# Patient Record
Sex: Female | Born: 1955 | Race: White | Hispanic: No | Marital: Married | Smoking: Never smoker
Health system: Southern US, Community
[De-identification: ages and names within clinical notes are randomized; demographics above are authoritative.]

## PROBLEM LIST (undated history)

## (undated) DIAGNOSIS — G20A1 Parkinson's disease without dyskinesia, without mention of fluctuations: Secondary | ICD-10-CM

## (undated) DIAGNOSIS — Z9221 Personal history of antineoplastic chemotherapy: Secondary | ICD-10-CM

## (undated) DIAGNOSIS — G8190 Hemiplegia, unspecified affecting unspecified side: Secondary | ICD-10-CM

## (undated) DIAGNOSIS — I10 Essential (primary) hypertension: Secondary | ICD-10-CM

## (undated) DIAGNOSIS — C50919 Malignant neoplasm of unspecified site of unspecified female breast: Secondary | ICD-10-CM

## (undated) DIAGNOSIS — Z780 Asymptomatic menopausal state: Secondary | ICD-10-CM

## (undated) DIAGNOSIS — L309 Dermatitis, unspecified: Secondary | ICD-10-CM

## (undated) DIAGNOSIS — Z923 Personal history of irradiation: Secondary | ICD-10-CM

## (undated) DIAGNOSIS — M25569 Pain in unspecified knee: Secondary | ICD-10-CM

## (undated) DIAGNOSIS — D219 Benign neoplasm of connective and other soft tissue, unspecified: Secondary | ICD-10-CM

## (undated) HISTORY — PX: COLONOSCOPY: SHX174

## (undated) HISTORY — PX: BREAST LUMPECTOMY: SHX2

## (undated) HISTORY — DX: Dermatitis, unspecified: L30.9

## (undated) HISTORY — PX: WISDOM TOOTH EXTRACTION: SHX21

## (undated) HISTORY — DX: Pain in unspecified knee: M25.569

## (undated) HISTORY — PX: TOTAL ABDOMINAL HYSTERECTOMY: SHX209

## (undated) HISTORY — DX: Parkinson's disease without dyskinesia, without mention of fluctuations: G20.A1

## (undated) HISTORY — DX: Hemiplegia, unspecified affecting unspecified side: G81.90

## (undated) HISTORY — DX: Benign neoplasm of connective and other soft tissue, unspecified: D21.9

## (undated) HISTORY — PX: OTHER SURGICAL HISTORY: SHX169

## (undated) HISTORY — PX: TRIGGER FINGER RELEASE: SHX641

## (undated) HISTORY — PX: SHOULDER ARTHROSCOPY: SHX128

## (undated) HISTORY — DX: Essential (primary) hypertension: I10

## (undated) HISTORY — DX: Asymptomatic menopausal state: Z78.0

## (undated) HISTORY — PX: CYSTOSCOPY: SUR368

---

## 1998-07-22 ENCOUNTER — Ambulatory Visit (HOSPITAL_COMMUNITY): Admission: RE | Admit: 1998-07-22 | Discharge: 1998-07-22 | Payer: Self-pay | Admitting: Unknown Physician Specialty

## 1999-07-22 ENCOUNTER — Encounter: Payer: Self-pay | Admitting: Obstetrics and Gynecology

## 1999-07-22 ENCOUNTER — Ambulatory Visit (HOSPITAL_COMMUNITY): Admission: RE | Admit: 1999-07-22 | Discharge: 1999-07-22 | Payer: Self-pay | Admitting: Obstetrics and Gynecology

## 2000-09-06 ENCOUNTER — Encounter: Payer: Self-pay | Admitting: Obstetrics and Gynecology

## 2000-09-06 ENCOUNTER — Ambulatory Visit (HOSPITAL_COMMUNITY): Admission: RE | Admit: 2000-09-06 | Discharge: 2000-09-06 | Payer: Self-pay | Admitting: Obstetrics and Gynecology

## 2001-09-10 ENCOUNTER — Ambulatory Visit (HOSPITAL_COMMUNITY): Admission: RE | Admit: 2001-09-10 | Discharge: 2001-09-10 | Payer: Self-pay | Admitting: Internal Medicine

## 2001-09-10 ENCOUNTER — Encounter: Payer: Self-pay | Admitting: Internal Medicine

## 2002-09-11 ENCOUNTER — Encounter: Payer: Self-pay | Admitting: Obstetrics and Gynecology

## 2002-09-11 ENCOUNTER — Ambulatory Visit (HOSPITAL_COMMUNITY): Admission: RE | Admit: 2002-09-11 | Discharge: 2002-09-11 | Payer: Self-pay | Admitting: Obstetrics and Gynecology

## 2003-12-17 ENCOUNTER — Ambulatory Visit (HOSPITAL_COMMUNITY): Admission: RE | Admit: 2003-12-17 | Discharge: 2003-12-17 | Payer: Self-pay | Admitting: Obstetrics and Gynecology

## 2004-12-20 ENCOUNTER — Ambulatory Visit (HOSPITAL_COMMUNITY): Admission: RE | Admit: 2004-12-20 | Discharge: 2004-12-20 | Payer: Self-pay | Admitting: Obstetrics and Gynecology

## 2005-12-21 ENCOUNTER — Ambulatory Visit (HOSPITAL_COMMUNITY): Admission: RE | Admit: 2005-12-21 | Discharge: 2005-12-21 | Payer: Self-pay | Admitting: Obstetrics and Gynecology

## 2007-01-01 ENCOUNTER — Ambulatory Visit (HOSPITAL_COMMUNITY): Admission: RE | Admit: 2007-01-01 | Discharge: 2007-01-01 | Payer: Self-pay | Admitting: Obstetrics and Gynecology

## 2008-03-12 ENCOUNTER — Ambulatory Visit (HOSPITAL_COMMUNITY): Admission: RE | Admit: 2008-03-12 | Discharge: 2008-03-12 | Payer: Self-pay | Admitting: Obstetrics and Gynecology

## 2008-09-16 ENCOUNTER — Ambulatory Visit: Payer: Self-pay | Admitting: Sports Medicine

## 2008-09-16 DIAGNOSIS — M2170 Unequal limb length (acquired), unspecified site: Secondary | ICD-10-CM

## 2008-09-16 DIAGNOSIS — M629 Disorder of muscle, unspecified: Secondary | ICD-10-CM

## 2008-09-16 DIAGNOSIS — M25569 Pain in unspecified knee: Secondary | ICD-10-CM | POA: Insufficient documentation

## 2008-10-14 ENCOUNTER — Ambulatory Visit: Payer: Self-pay | Admitting: Sports Medicine

## 2009-03-13 ENCOUNTER — Ambulatory Visit (HOSPITAL_COMMUNITY): Admission: RE | Admit: 2009-03-13 | Discharge: 2009-03-13 | Payer: Self-pay | Admitting: Obstetrics and Gynecology

## 2016-06-30 ENCOUNTER — Ambulatory Visit (INDEPENDENT_AMBULATORY_CARE_PROVIDER_SITE_OTHER): Payer: PRIVATE HEALTH INSURANCE | Admitting: Neurology

## 2016-06-30 ENCOUNTER — Encounter: Payer: Self-pay | Admitting: Neurology

## 2016-06-30 VITALS — BP 127/81 | HR 65 | Resp 14 | Ht 63.0 in | Wt 133.0 lb

## 2016-06-30 DIAGNOSIS — G1221 Amyotrophic lateral sclerosis: Secondary | ICD-10-CM | POA: Diagnosis not present

## 2016-06-30 DIAGNOSIS — R252 Cramp and spasm: Secondary | ICD-10-CM

## 2016-06-30 DIAGNOSIS — G20 Parkinson's disease: Secondary | ICD-10-CM

## 2016-06-30 DIAGNOSIS — G8190 Hemiplegia, unspecified affecting unspecified side: Secondary | ICD-10-CM

## 2016-06-30 DIAGNOSIS — G35 Multiple sclerosis: Secondary | ICD-10-CM | POA: Diagnosis not present

## 2016-06-30 DIAGNOSIS — R200 Anesthesia of skin: Secondary | ICD-10-CM | POA: Diagnosis not present

## 2016-06-30 DIAGNOSIS — R258 Other abnormal involuntary movements: Secondary | ICD-10-CM | POA: Diagnosis not present

## 2016-06-30 DIAGNOSIS — G1229 Other motor neuron disease: Secondary | ICD-10-CM

## 2016-06-30 DIAGNOSIS — R202 Paresthesia of skin: Secondary | ICD-10-CM

## 2016-06-30 DIAGNOSIS — W19XXXA Unspecified fall, initial encounter: Secondary | ICD-10-CM

## 2016-06-30 NOTE — Progress Notes (Signed)
[facility]    Provider:  Dr Lucia GaskinsAhern Referring Provider: Jarrett SohoWharton, Courtney, PA-C Primary Care Physician:  Jarrett SohoWharton, Courtney, PA-C  CC:  Stiff right arm, right hand weakness, right leg stiff and some tremor  HPI:  Breanna Powers is a 60 y.o. female here as a referral from Dr. Delena ServeWharton for Tremor. Past medical history of one alcoholic beverage a day, 4 cups daily of caffeine, otherwise no significant past medical history. She has a family history of Parkinson's disease in her father. About a year ago she noticed that her right arm was staying in a flexed position at the elbow. She has noticed weakness in her right arm and right leg. Symptoms are slowly progressive. Her family started noticing as well. No pain in the limbs just mostly weakness and tremor and decreased movement. She also started noticing tremor mostly with action and also at rest. Lately she has broken 2 of her husbands statues due to tremor and right arm weakness, she is getting tremor worse with action and with fine motor tasks, dropping objects, she has a tremor endorses it at rest as well. Her right leg drags a little when she walks. But she just got done a triathlon. She runs. She has numbness and tingling in the right arm more in the forearm and in the hand the whole hand. Her hand will clench and fist. It is slowly progressing to the point where other people are noticing. No neck pain, no radicular symptoms, no known triggering events, worse with fatigue. No trauma or inciting events. She has cramps in the right leg in the calf and in the foot. No vision changes, no slurred or changes in speech, no memory changes, no headaches, no neck pain or radicular symptoms. She is tripping over the right foot and has fallen. Symptoms are slowly progressive. She even has to brush with the left hand now because of the symptoms. No other focal neurologic deficits or complaints. No other family history of tremors or neuromuscular  disorders. Her father was diagnosed in his early 7460s with Parkinson's disease.  Reviewed notes, labs and imaging from outside physicians, which showed:  Review primary care notes. She is noted intermittent weakness of right upper extremity, associated tremor at rest which is trying to do things such as work on computer or lifting objects, does not seem to be worse over the last 6 months. She has been more aware of it the last 2 months when she would go running. Feels that she is dragging her right leg when she runs like it is more benefit to move. No pain associated. No numbness or tingling. Reviewed exam which showed normal general appearance, speech, headache, mouth, nose, pharynx, neck, chest, heart, breasts, abdomen, extremities. Neurologically she had gross motor and sensory normal strength, sensation and gait, heel-to-toe normal, finger to nose with very slight tremor noted of right arm with task, strength of right upper extremity very slightly decreased compared to left with flexion at elbow and shoulder specifically, grip strength normal bilaterally, normal range of motion. No decreased ROM. No other tremors or neuromuscular or degenerative disease in the family. Father diagnosed in his early 260s.   Labs ordered included vitamin D, HCV, B12, TSH, CMP and lipid panel(results not included in paperwork will request records)  Creatinine 0.96, TSH 1.290 labs drawn on 06/24/2016.  Review of Systems: Patient complains of symptoms per HPI as well as the following symptoms: Numbness, weakness, tremor. Pertinent negatives per HPI. All others negative.  Social History   Social History  . Marital status: Married    Spouse name: N/A  . Number of children: 0  . Years of education: Assoc   Occupational History  . [employer]    Social History Main Topics  . Smoking status: Never Smoker  . Smokeless tobacco: Never Used  . Alcohol use Yes  . Drug use: No  . Sexual activity: Not on file    Other Topics Concern  . Not on file   Social History Narrative   Patient drinks 3-4 cups of coffee a day     Family History  Problem Relation Age of Onset  . Parkinson's disease Father   . Heart disease Father   . Breast cancer Sister   . Breast cancer Paternal Aunt   . Breast cancer Paternal Grandmother   . Heart disease Paternal Grandfather     Past Medical History:  Diagnosis Date  . Knee pain     Past Surgical History:  Procedure Laterality Date  . WISDOM TOOTH EXTRACTION      Current Outpatient Prescriptions  Medication Sig Dispense Refill  . Cholecalciferol (VITAMIN D3) 1000 units CAPS Take by mouth.    . Multiple Vitamin (MULTIVITAMIN) tablet Take by mouth.    . Omega-3 Fatty Acids (FISH OIL) 1200 MG CAPS Take by mouth.    . Vitamins-Lipotropics (B-STRESS PO) Take by mouth.     No current facility-administered medications for this visit.     Allergies as of 06/30/2016  . (No Known Allergies)    Vitals: BP 127/81   Pulse 65   Resp 14   Ht 5\' 3"  (1.6 m)   Wt 133 lb (60.3 kg)   BMI 23.56 kg/m  Last Weight:  Wt Readings from Last 1 Encounters:  06/30/16 133 lb (60.3 kg)   Last Height:   Ht Readings from Last 1 Encounters:  06/30/16 5\' 3"  (1.6 m)    Physical exam: Exam: Gen: NAD, conversant, well nourised, well groomed                     CV: RRR, no MRG. No Carotid Bruits. No peripheral edema, warm, nontender Eyes: Conjunctivae clear without exudates or hemorrhage  Neuro: Detailed Neurologic Exam  Speech:    Speech is normal; fluent and spontaneous with normal comprehension.  Cognition:    The patient is oriented to person, place, and time;     recent and remote memory intact;     language fluent;     normal attention, concentration,     fund of knowledge Cranial Nerves:    The pupils are equal, round, and reactive to light. The fundi are normal and spontaneous venous pulsations are present. Visual fields are full to finger  confrontation. Extraocular movements are intact. Trigeminal sensation is intact and the muscles of mastication are normal. The face is symmetric. The palate elevates in the midline. Hearing intact. Voice is normal. Shoulder shrug is normal. The tongue has normal motion without fasciculations.   Coordination:    No dysmetria on finger-to-nose or heel-to-shin  Gait: decreased arm swing on the right. Heel-toe and tandem gait are normal.   Motor Observation:     No asymmetry, no atrophy, Mild postural tremor right hand. Right arm is held in flexion at the elbow. Tone:    Increased in the right arm with cogwheeling on facilitation  Posture:    Posture is normal. normal erect    Strength: Right pronator drift. Right  deltoid 4/5, right biceps 5-, right triceps 3+/5, right UE extensors 4/5. Dec grip on the right.  5-/5 right hip flexion  and leg flexion otherwise normal.         Sensation: Decreased to pinprick in the right arm intact in the right     Reflex Exam:  DTR's:    Deep tendon reflexes in the upper and lower extremities are brisk bilaterally with relative increase on the right Toes:    The toes are downgoing bilaterally.   Clonus:    Clonus is absent.      Assessment/Plan:  This is a 60 year old female with no significant past medical history with right-sided arm and leg weakness, increased tone in the right arm with cogwheeling on facilitation, increased reflexes on the right, decreased arm swing on the right, mild postural tremor, no resting tremor noted, decreased sensation in the right arm, holds right arm flexion at the elbow. Unclear etiology, has some signs of parkinsonism but the weakness and sensory loss is unusual for this condition. We'll check MRI of the brain and cervical spine for lesions or masses. We'll perform a 4 limb EMG as well. Pending results of the studies may need further testing.  Cc: Irene Shipper, MD  [facility] [address] [place], [place] [address]  Phone [phone] Fax [phone]

## 2016-06-30 NOTE — Patient Instructions (Signed)
Remember to drink plenty of fluid, eat healthy meals and do not skip any meals. Try to eat protein with a every meal and eat a healthy snack such as fruit or nuts in between meals. Try to keep a regular sleep-wake schedule and try to exercise daily, particularly in the form of walking, 20-30 minutes a day, if you can.   As far as diagnostic testing: MRI of the brain and cervical cord, emg/ncs  I would like to see you back for emg/ncs, sooner if we need to. Please call us with any interim questions, concerns, problems, updates or refill requests.   Our phone number is [phone]. We also have an after hours call service for urgent matters and there is a physician on-call for urgent questions. For any emergencies you know to call 911 or go to the nearest emergency room

## 2016-07-07 ENCOUNTER — Telehealth: Payer: Self-pay | Admitting: Neurology

## 2016-07-07 NOTE — Telephone Encounter (Signed)
I highly encourage patient to have the tests done, she has progressive right-sided weakness and this could be the sign of something serious like a tumor or mass in the brain or cervical spine. I highly recommend the MRIs, we can hold off on the emg. Please let her know. If she absolutely cannot have the MRIs We could at least order a CT scan of the brain which isn't as good as MRi and may miss small lesions however it is some imaging of the brain if she cannot afford the MRIs. thanks

## 2016-07-07 NOTE — Telephone Encounter (Signed)
Dr Lucia Gaskins- please advise  Tried calling patient back on work number. Ann picked up and said it was wrong number.   Called home number listed and spoke to patient. She stated something came up. Cannot afford at this time to have the MRI/EMG studies. She states she wants to wait until the first of the year and she is going to call back about testing. She has no f/u scheduled at this time which she is aware of. She wanted to make sure Dr Lucia Gaskins was okay with her holding off with tests and that she wasn't doing anything "stupid" by not having them done. Advised I will give her the message and call her back if she feels she needs the testing before then. She verbalized understanding.

## 2016-07-07 NOTE — Telephone Encounter (Signed)
Pt called in wanting to discuss MRI and EMG orders. She can not afford them at this time . Please call and advise

## 2016-07-08 NOTE — Telephone Encounter (Signed)
Dr Lucia Gaskins- FYI  Called patient back. Relayed Dr Trevor Mace message below. She verbalized understanding. She stated she was talking with her husband and a friend who recommended massage therapy. She is going to look into a company called [facility] as an alternative.   I did advise she can set up a payment plan for MRI. This is an option. She verbalized understanding and stated intially she "panicked" and did not know what to do. She had called her insurance company and found out her copay was $100 and then they will pay 60%. She has a 3,000 dollar deductible. She requested she think about it and call back later today. I did advise we close at 12pm. She requested to take the weekend to think about it and will call back Monday to let us know what she would like to do. She is going to do her research and find out how much MRI would cost her. She did not find out this information from before. Advised I will let Dr Lucia Gaskins know.

## 2016-07-12 ENCOUNTER — Other Ambulatory Visit: Payer: Self-pay

## 2016-07-14 ENCOUNTER — Encounter: Payer: PRIVATE HEALTH INSURANCE | Admitting: Neurology

## 2016-08-25 ENCOUNTER — Other Ambulatory Visit: Payer: Self-pay | Admitting: Family Medicine

## 2016-08-25 DIAGNOSIS — Z1231 Encounter for screening mammogram for malignant neoplasm of breast: Secondary | ICD-10-CM

## 2016-09-27 ENCOUNTER — Ambulatory Visit
Admission: RE | Admit: 2016-09-27 | Discharge: 2016-09-27 | Disposition: A | Discharge status: Home / Self Care | Payer: PRIVATE HEALTH INSURANCE | Source: Ambulatory Visit | Attending: Family Medicine | Admitting: Family Medicine

## 2016-09-27 DIAGNOSIS — Z1231 Encounter for screening mammogram for malignant neoplasm of breast: Secondary | ICD-10-CM

## 2018-02-12 IMAGING — MG 2D DIGITAL SCREENING BILATERAL MAMMOGRAM WITH CAD AND ADJUNCT TO
9 of 13 series · 9 of 29 positions shown · non-contrast
Comparison: Previous exam(s).

CLINICAL DATA: Screening.

EXAM:
2D DIGITAL SCREENING BILATERAL MAMMOGRAM WITH CAD AND ADJUNCT TOMO

[R CC (1 of 2)]
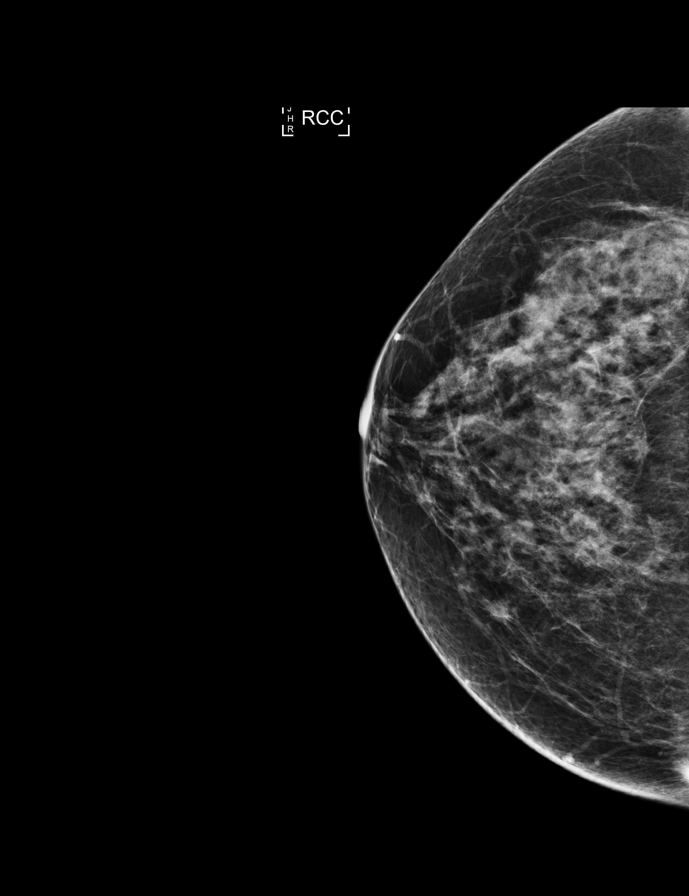

[R CC (2 of 2)]
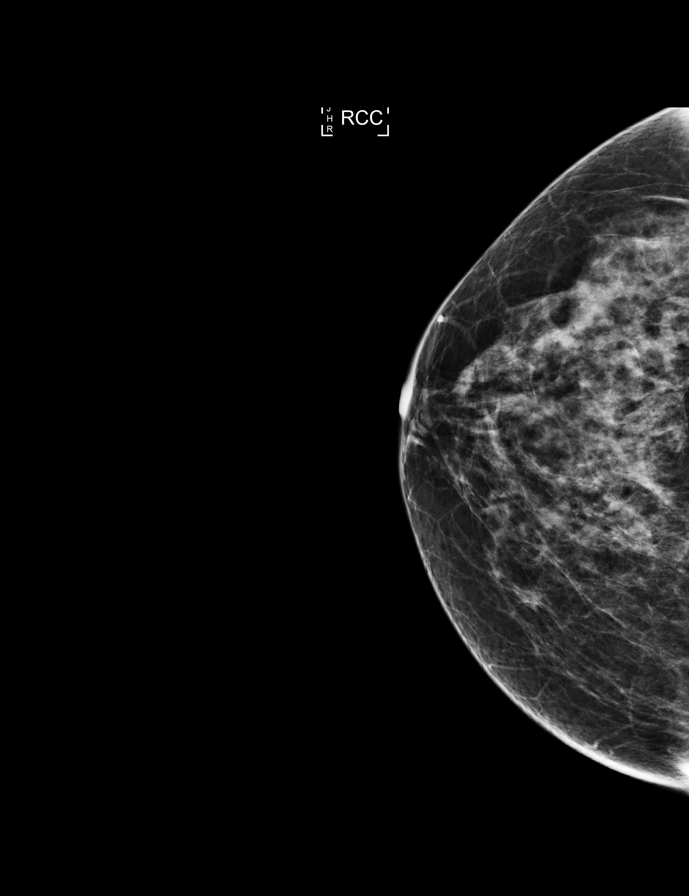

[L CC synth-2D]
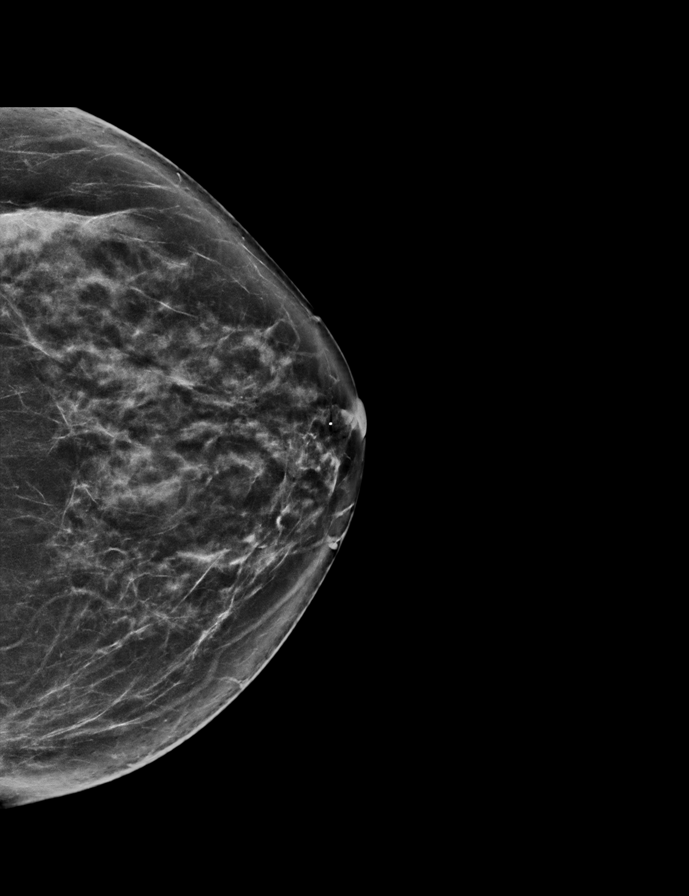

[L MLO synth-2D]
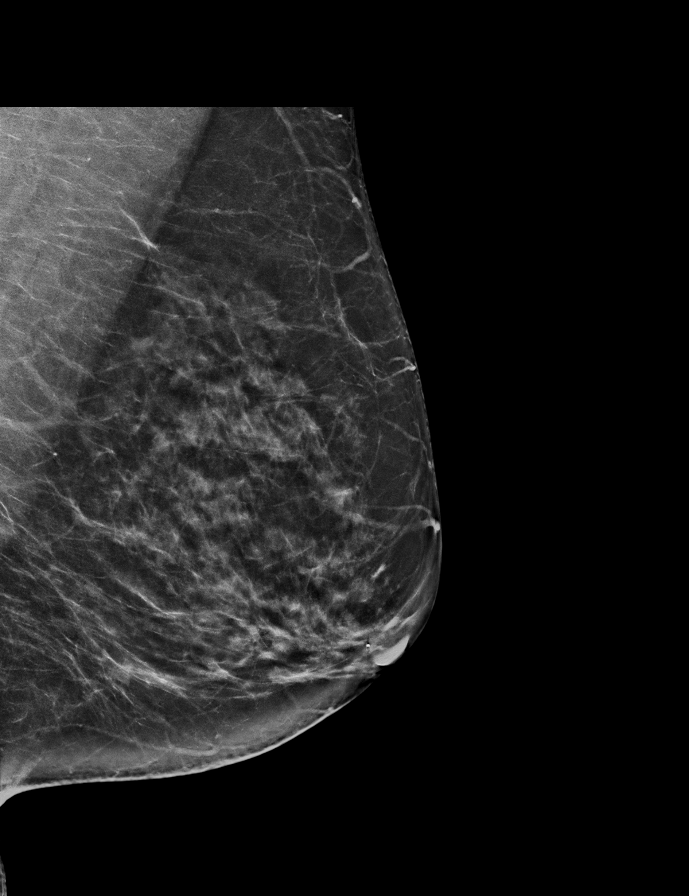

[L MLO]
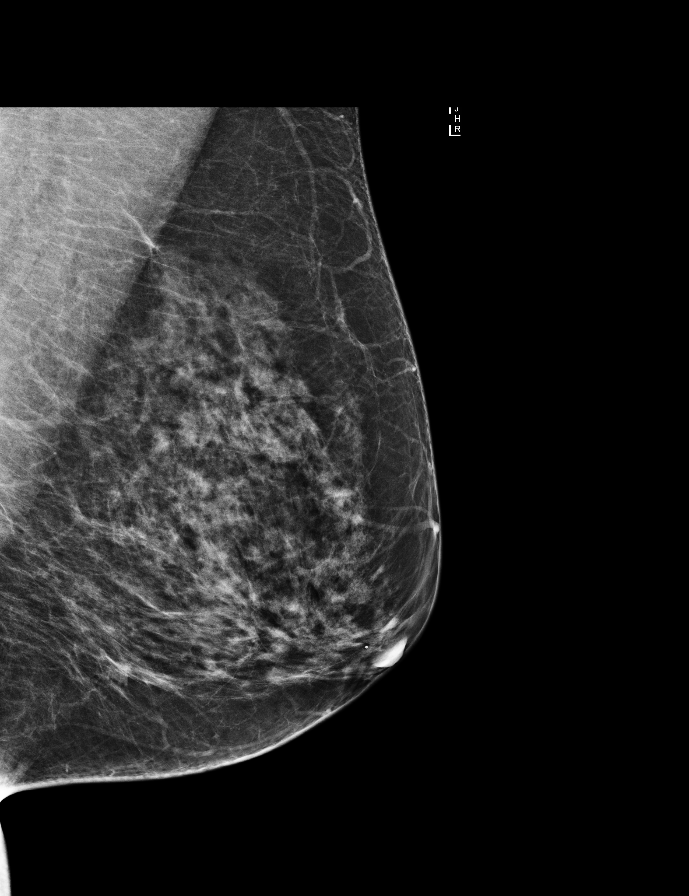

[R MLO synth-2D]
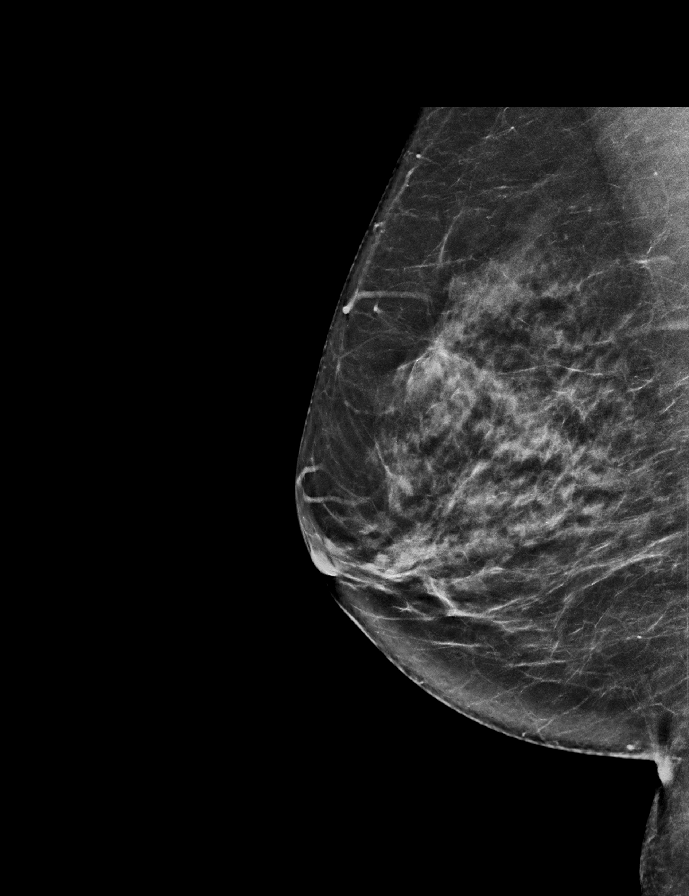

[R MLO]
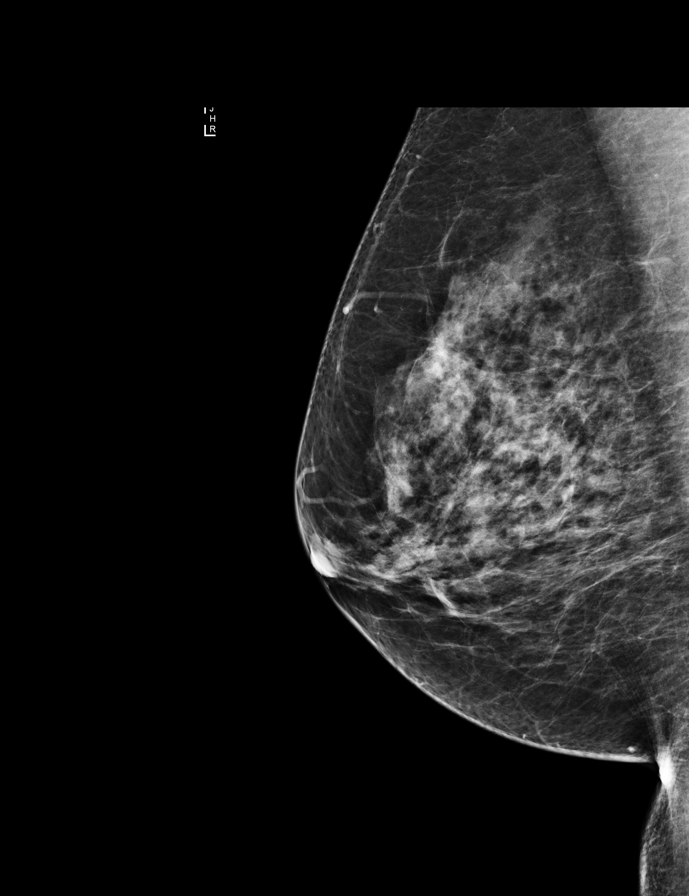

[R CC synth-2D]
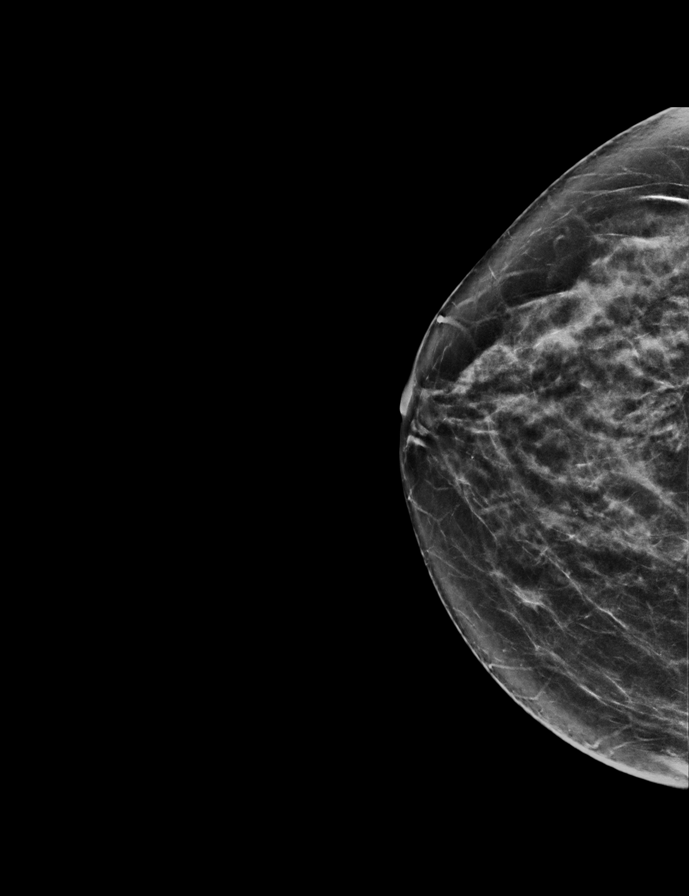

[L CC]
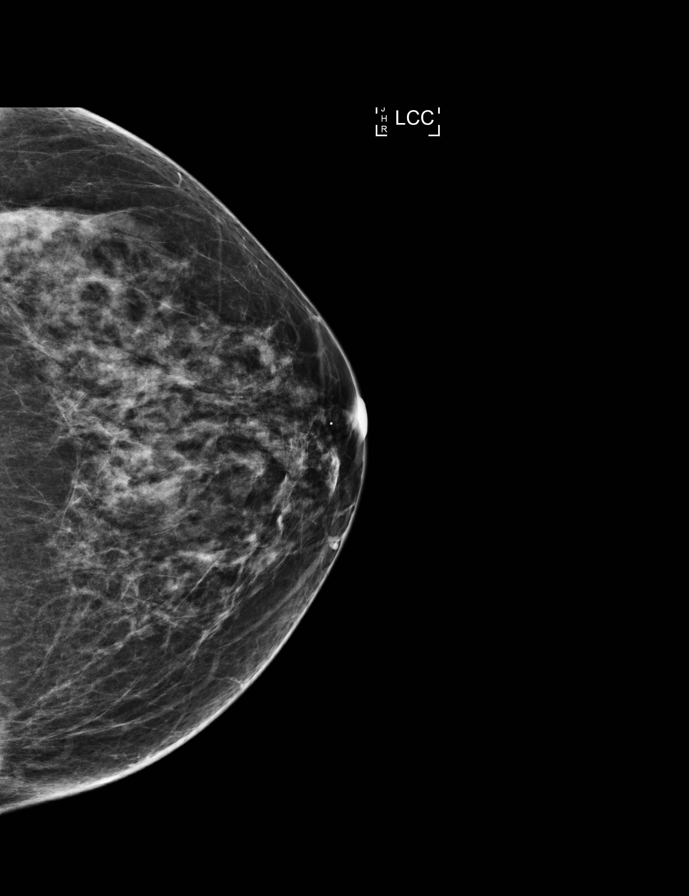

[9 of 29 positions shown; findings below may reference images not displayed]

ACR Breast Density Category c: The breast tissue is heterogeneously
dense, which may obscure small masses.
FINDINGS: There are no findings suspicious for malignancy. Images were
processed with CAD.
IMPRESSION: No mammographic evidence of malignancy. A result letter of this
screening mammogram will be mailed directly to the patient.

RECOMMENDATION:
Screening mammogram in one year. (Code:[id number])

BI-RADS CATEGORY  1: Negative.

## 2022-10-31 DIAGNOSIS — K080 Exfoliation of teeth due to systemic causes: Secondary | ICD-10-CM | POA: Diagnosis not present

## 2022-12-05 ENCOUNTER — Telehealth: Payer: Self-pay

## 2022-12-05 NOTE — Telephone Encounter (Signed)
While I would generally recommend staying w Dr. Linus Mako and his practice, if the patient still wants to switch to [facility], I would be okay with the [facility] and provider switch.

## 2022-12-05 NOTE — Telephone Encounter (Signed)
Completely fine, thanks

## 2022-12-05 NOTE — Telephone Encounter (Signed)
We received a new referral for a patient that was last seen by Dr Jaynee Eagles in 2017 for hemiparesis. She is currently established with Dr. Linus Mako at [facility] for Parkinson's Disease, but is retiring soon and would like to establish closer to home for her care. She is requesting to transfer care to Dr Rexene Alberts.  Please advise if this is acceptable.  Thanks!

## 2023-01-09 DIAGNOSIS — K080 Exfoliation of teeth due to systemic causes: Secondary | ICD-10-CM | POA: Diagnosis not present

## 2023-01-11 DIAGNOSIS — K080 Exfoliation of teeth due to systemic causes: Secondary | ICD-10-CM | POA: Diagnosis not present

## 2023-02-01 DIAGNOSIS — I10 Essential (primary) hypertension: Secondary | ICD-10-CM | POA: Diagnosis not present

## 2023-02-01 DIAGNOSIS — G20C Parkinsonism, unspecified: Secondary | ICD-10-CM | POA: Diagnosis not present

## 2023-02-01 DIAGNOSIS — R159 Full incontinence of feces: Secondary | ICD-10-CM | POA: Diagnosis not present

## 2023-02-01 DIAGNOSIS — R251 Tremor, unspecified: Secondary | ICD-10-CM | POA: Diagnosis not present

## 2023-02-06 DIAGNOSIS — K080 Exfoliation of teeth due to systemic causes: Secondary | ICD-10-CM | POA: Diagnosis not present

## 2023-03-01 ENCOUNTER — Encounter: Payer: Self-pay | Admitting: *Deleted

## 2023-03-02 ENCOUNTER — Ambulatory Visit: Payer: Medicare Other | Admitting: Neurology

## 2023-03-02 ENCOUNTER — Encounter: Payer: Self-pay | Admitting: Neurology

## 2023-03-02 VITALS — BP 114/68 | HR 81 | Ht 62.0 in | Wt 135.0 lb

## 2023-03-02 DIAGNOSIS — G20B1 Parkinson's disease with dyskinesia, without mention of fluctuations: Secondary | ICD-10-CM

## 2023-03-02 NOTE — Patient Instructions (Signed)
I think your Parkinson's disease has remained fairly stable, which is reassuring. Nevertheless, as you know, this disease does progress with time. It can affect your balance, your memory, your mood, your bowel and bladder function, your posture, balance and walking and your activities of daily living. However, there are good supportive treatments and symptomatic treatments available, so most patients have a change to a good quality life and life expectancy is not typically altered. Overall you are doing fairly well but I do want to suggest a few things today:  Remember to drink plenty of fluid at least 6 glasses (8 oz each), eat healthy meals and do not skip any meals. Try to eat protein with a every meal and eat a healthy snack such as fruit or nuts in between meals. Try to keep a regular sleep-wake schedule and try to exercise daily, particularly in the form of walking, 20-30 minutes a day, if you can.   Taking your medication on schedule is key.   Try to stay active physically and mentally. Engage in social activities in your community and with your family and try to keep up with current events by reading the newspaper or watching the news. Try to do word puzzles and you may like to do puzzles and brain games on the computer such as on [email or web address]/.   As far as your medications are concerned, I would like to suggest that you take your current medication without any need for additional changes at this time. We can renew your prescriptions when they come up for renewal.      As far as diagnostic testing, I don't think you need any test at this time, we may consider a swallow study down the road.   I would like to see you back in 6 months, sooner if we need to. Please call us with any interim questions, concerns, problems, updates or refill requests.  Our phone number is [phone]. We also have an after hours call service for urgent matters and there is a physician on-call for urgent questions, that  cannot wait till the next work day. For any emergencies you know to call 911 or go to the nearest emergency room.   You can email me through my chart and also leave a phone message for my nurse.

## 2023-03-02 NOTE — Progress Notes (Signed)
Subjective:    Patient ID: Breanna Powers is a 67 y.o. female.  HPI    Huston Foley, MD, PhD [facility] [address] [address] [place], [place] [address]  Dear Dr. Rubin Payor, I saw your patient, Breanna Powers, upon your kind request in my neurologic clinic today for initial consultation of her Parkinson's disease, for transfer of care.  The patient is unaccompanied today.  As you know, Breanna Powers is a 67 year old right-handed female with an underlying medical history of knee pain, eczema, status post right shoulder arthroscopic surgery in 2018, who reports a several year history of incoordination and weakness on the right side as well as intermittent tremors.  She was diagnosed with Parkinson's disease in 2019.  She had seen Dr. Lucia Gaskins in our office in 2017 and had seen Dr. Alton Revere in November 2018.  She had an EMG and nerve conduction velocity test through [facility] under Dr. Antonietta Barcelona in February 2019 with normal findings.   Her symptoms date back to 2017 when she developed intermittent tremors and slowness and stiffness affecting primarily her right upper extremity.  She has been on Sinemet and pramipexole for several years.  She was started on amantadine in November 2020.  She has been taking stable doses of amantadine 100 mg twice daily, pramipexole 0.5 mg 4 times daily, and generic Sinemet 25-100 mg strength 1 pill 4 times daily for the past few years.  She reports good results with her medications.  She was started on pramipexole in July 2019 when she saw Dr. Alton Revere.  She started Sinemet in June 2019.  I reviewed multiple records in her chart, she was last seen at [facility] on 09/09/2022 by Breanna Call, PA.  She was advised to continue her medication regimen.  She requested a transfer of care as she retired and no longer works in [place].  She had a brain MRI without contrast through [facility] on 09/16/2017 and I reviewed  the results: Impression: Normal MRI of the brain without contrast.  Incidental note of pansinusitis without air-fluid level.  She fell in October 2023 but did not have any head injury or loss of consciousness, no major injuries thankfully.  She has no significant constipation, no significant cognitive complaints.  She exercises regularly, 3 to 4 times a week at the gym, she attends weightlifting and yoga and spinning classes.  She takes her amantadine at 7 AM and 5 PM, she takes her Sinemet and pramipexole at 7, 12, 5 PM and 10 PM daily.  She drives fairly well but tries to avoid bad weather or nighttime driving.  Her dad had Parkinson's disease and lived to be 75.  She is the middle of 5 girls altogether, 1 sister passed away from breast cancer, her younger sibling is a brother, none of her siblings have parkinsonism.  She lives with her husband, he has no children, they have 3 dogs in the household.  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  She drinks quite a bit of caffeine in the form of coffee, about 4 cups/day and occasional green tea.  She drinks alcohol about 3-5 times a week, up to 2 drinks at a time.  Her Past Medical History Is Significant For: Past Medical History:  Diagnosis Date   Dermatitis    Eczema    Fibroid    Hemiparesis (HCC)    right side "part of the parkinson's" also had  a muscle tear repair in R shoulder   Knee pain    Menopause     Her Past Surgical History Is Significant For: Past Surgical History:  Procedure Laterality Date   COLONOSCOPY     2021   CYSTOSCOPY     10/2019   SHOULDER ARTHROSCOPY Right    2018 W biceps tenodesis   TOTAL ABDOMINAL HYSTERECTOMY     10-29-2019   TRIGGER FINGER RELEASE Left    09/29/2020   WISDOM TOOTH EXTRACTION      Her Family History Is Significant For: Family History  Problem Relation Age of Onset   Neuropathy Mother    Dementia Mother    Depression Mother    Hypertension  Mother    Restless legs syndrome Mother    Parkinson's disease Father    Heart disease Father    Breast cancer Sister    Atrial fibrillation Sister    Osteoporosis Sister    Heart disease Sister    Breast cancer Paternal Aunt    Breast cancer Paternal Grandmother    Heart disease Paternal Grandfather     Her Social History Is Significant For: Social History   Socioeconomic History   Marital status: Married    Spouse name: Not on file   Number of children: 0   Years of education: Assoc   Highest education level: Not on file  Occupational History   Occupation: [employer]  Tobacco Use   Smoking status: Never   Smokeless tobacco: Never  Vaping Use   Vaping Use: Never used  Substance and Sexual Activity   Alcohol use: Yes    Comment: occ; no more than 2 drinks ina day   Drug use: Never   Sexual activity: Not on file    Comment: TAH  Other Topics Concern   Not on file  Social History Narrative   Patient drinks 3-4 cups of coffee a day    Associates degree   Right handed   Goes to the gym for exercise 3-4 times per week   Social Determinants of Health   Financial Resource Strain: Not on file  Food Insecurity: Not on file  Transportation Needs: Not on file  Physical Activity: Not on file  Stress: Not on file  Social Connections: Not on file    Her Allergies Are:  No Known Allergies:   Her Current Medications Are:  Outpatient Encounter Medications as of 03/02/2023  Medication Sig   Amantadine HCl 100 MG tablet Take 100 mg by mouth 2 (two) times daily.   B Complex Vitamins (VITAMIN B COMPLEX) CAPS Take 1 capsule by mouth daily at 6 (six) AM.   Calcium Carb-Cholecalciferol (CALCIUM 600/VITAMIN D3) 600-20 MG-MCG TABS Take by mouth 2 (two) times daily.   carbidopa-levodopa (SINEMET IR) 25-100 MG tablet Take 1 tablet by mouth 4 (four) times daily.   Cholecalciferol (VITAMIN D3) 1000 units CAPS Take 2,000 Units by mouth.   losartan (COZAAR) 25 MG tablet Take 25 mg by  mouth daily.   Multiple Vitamin (MULTIVITAMIN) tablet Take by mouth.   Omega-3 Fatty Acids (FISH OIL) 1000 MG CAPS Take by mouth.   pramipexole (MIRAPEX) 0.5 MG tablet Take 0.5 mg by mouth 4 (four) times daily.   TURMERIC PO Take 500 mg by mouth.   vitamin B-12 (CYANOCOBALAMIN) 500 MCG tablet Take 500 mcg by mouth daily.   Vitamin E 180 MG (400 UNIT) CAPS Take 180 mg by mouth.   [DISCONTINUED] Calcium Carb-Cholecalciferol 600-10 MG-MCG TABS Take 1  tablet by mouth daily.   [DISCONTINUED] Omega-3 Fatty Acids (FISH OIL) 1200 MG CAPS Take by mouth.   No facility-administered encounter medications on file as of 03/02/2023.  :   Review of Systems:  Out of a complete 14 point review of systems, all are reviewed and negative with the exception of these symptoms as listed below:  Review of Systems  Neurological:        Patient is here alone for transfer of care from Dr Rubin Payor for Parkinson's. She retired and would like a more convenient location for care as she used to work at [employer] in [place]. She feels she is stable overall on Sinemet, Pramipexole and Amantadine.     Objective:  Neurological Exam  Physical Exam Physical Examination:   Vitals:   03/02/23 1443  BP: 114/68  Pulse: 81    General Examination: The patient is a very pleasant 67 y.o. female in no acute distress. She appears well-developed and well-nourished and well groomed.   HEENT: Normocephalic, atraumatic, pupils are equal, round and reactive to light, extraocular tracking is good without limitation to gaze excursion or nystagmus noted. Hearing is grossly intact. Face is symmetric with very mild facial masking, speech is clear with slight raspiness and hypophonia but otherwise normal, no dysarthria, no voice tremor.  She has a very mild nuchal rigidity, good range of motion, no carotid bruits.  Airway examination reveals mild mouth dryness, otherwise nonfocal findings.  Tongue protrudes centrally and palate elevates  symmetrically.    Chest: Clear to auscultation without wheezing, rhonchi or crackles noted.  Heart: S1+S2+0, regular and normal without murmurs, rubs or gallops noted.   Abdomen: Soft, non-tender and non-distended.  Extremities: There is no pitting edema in the distal lower extremities bilaterally.   Skin: Warm and dry without trophic changes noted.   Musculoskeletal: exam reveals no obvious joint deformities.   Neurologically:  Mental status: The patient is awake, alert and oriented in all 4 spheres. Her immediate and remote memory, attention, language skills and fund of knowledge are good.  No evidence of any significant bradyphrenia, aphasia, agnosia, apraxia or anomia. Speech as above. Thought process is linear. Mood is normal and affect is normal.  Cranial nerves II - XII are as described above under HEENT exam.  Motor exam: Normal bulk, and strength noted, slight increase in tone in the right upper extremity, no obvious resting or postural or action tremor.   Mild generalized dyskinesias noted. Fine motor skills and coordination: Mild to moderately impaired with finger taps, hand movements and foot taps in the right upper extremity in the right lower extremity, better on the left altogether.    Cerebellar testing: No dysmetria or intention tremor. There is no truncal or gait ataxia.  Sensory exam: intact to light touch in the upper and lower extremities.  Gait, station and balance: She stands without differential occultly, posture is age-appropriate, mild decrease in arm swing on the right, otherwise no shuffling, mild dyskinesias while walking as well.  No sustained dystonic posturing.   Assessment and Plan:  In summary, Chantilly Bednarek is a very pleasant 67 y.o.-year old female with an underlying medical history of knee pain, eczema, status post right shoulder arthroscopic surgery in 2018, who presents for evaluation of her parkinsonism of several years duration, symptoms dating back  to 2017.  She was diagnosed with Parkinson's disease in or around 2019 and has been on medication since around that time, started levodopa and shortly thereafter pramipexole, she started amantadine  in 2020.  She is compliant with her medication and reports good results, she has had no major difficulty tolerating her medications.  She has developed mild dyskinesias over time.  She had a fall about 6 months ago.  We talked about the importance of regular exercise, maintaining a healthy lifestyle, medication compliance and fall prevention.  She is advised to continue with her current medication regimen.  She does not need a refill quite yet.  She is advised to be mindful about regular alcohol consumption.  She is advised to follow-up routinely in this clinic in 6 months, sooner if needed.  Reviewing prior records indicates that she has remained quite stable with minimal progression over time.  I answered all her questions today and she was in agreement with our plan.  This was an extended visit of over 1 hour with extended chart review involved.   Thank you very much for allowing me to participate in the care of this nice patient. If I can be of any further assistance to you please do not hesitate to Powers me at [phone].  Sincerely,   Huston Foley, MD, PhD

## 2023-03-08 DIAGNOSIS — G20C Parkinsonism, unspecified: Secondary | ICD-10-CM | POA: Diagnosis not present

## 2023-03-08 DIAGNOSIS — R159 Full incontinence of feces: Secondary | ICD-10-CM | POA: Diagnosis not present

## 2023-03-08 DIAGNOSIS — Z1330 Encounter for screening examination for mental health and behavioral disorders, unspecified: Secondary | ICD-10-CM | POA: Diagnosis not present

## 2023-03-21 DIAGNOSIS — H903 Sensorineural hearing loss, bilateral: Secondary | ICD-10-CM | POA: Diagnosis not present

## 2023-05-01 ENCOUNTER — Telehealth: Payer: Self-pay | Admitting: Neurology

## 2023-05-01 NOTE — Telephone Encounter (Signed)
Have you received anything yet? We do not have anything in POD

## 2023-05-01 NOTE — Telephone Encounter (Signed)
Breanna Powers called from [place]. Stated she is following-up on medical clearance form.

## 2023-05-02 NOTE — Telephone Encounter (Signed)
Form placed in MR for pick up  

## 2023-05-02 NOTE — Telephone Encounter (Signed)
Surgical clearance received from [facility]. Dr Vianne Bulls rq clearance d/t parkinson's specifically does patient have the cognitive function and able to consent for surgery? Clearance placed on MD desk for review and signature.

## 2023-05-02 NOTE — Telephone Encounter (Signed)
Form signed, pls retain a copy to be scanned under Media.

## 2023-05-31 DIAGNOSIS — H524 Presbyopia: Secondary | ICD-10-CM | POA: Diagnosis not present

## 2023-06-15 DIAGNOSIS — K080 Exfoliation of teeth due to systemic causes: Secondary | ICD-10-CM | POA: Diagnosis not present

## 2023-08-14 DIAGNOSIS — G8929 Other chronic pain: Secondary | ICD-10-CM | POA: Diagnosis not present

## 2023-08-14 DIAGNOSIS — I10 Essential (primary) hypertension: Secondary | ICD-10-CM | POA: Diagnosis not present

## 2023-08-14 DIAGNOSIS — Z0000 Encounter for general adult medical examination without abnormal findings: Secondary | ICD-10-CM | POA: Diagnosis not present

## 2023-08-14 DIAGNOSIS — M25511 Pain in right shoulder: Secondary | ICD-10-CM | POA: Diagnosis not present

## 2023-08-14 DIAGNOSIS — Z136 Encounter for screening for cardiovascular disorders: Secondary | ICD-10-CM | POA: Diagnosis not present

## 2023-08-30 ENCOUNTER — Other Ambulatory Visit: Payer: Self-pay | Admitting: Family Medicine

## 2023-08-30 DIAGNOSIS — Z1231 Encounter for screening mammogram for malignant neoplasm of breast: Secondary | ICD-10-CM

## 2023-08-31 ENCOUNTER — Other Ambulatory Visit: Payer: Self-pay | Admitting: Family Medicine

## 2023-08-31 DIAGNOSIS — M8580 Other specified disorders of bone density and structure, unspecified site: Secondary | ICD-10-CM

## 2023-09-07 ENCOUNTER — Ambulatory Visit: Payer: Medicare Other | Admitting: Neurology

## 2023-09-07 ENCOUNTER — Encounter: Payer: Self-pay | Admitting: Neurology

## 2023-09-07 VITALS — BP 106/72 | HR 75 | Ht 62.0 in | Wt 130.2 lb

## 2023-09-07 DIAGNOSIS — G20B1 Parkinson's disease with dyskinesia, without mention of fluctuations: Secondary | ICD-10-CM

## 2023-09-07 MED ORDER — CARBIDOPA-LEVODOPA ER 50-200 MG PO TBCR
1.0000 | EXTENDED_RELEASE_TABLET | Freq: Every day | ORAL | 5 refills | Status: DC
Start: 2023-09-07 — End: 2024-08-20

## 2023-09-07 NOTE — Progress Notes (Signed)
Subjective:    Patient ID: Breanna Powers is a 67 y.o. female.  HPI    Interim history:   Breanna Powers is a 67 year old right-handed female with an underlying medical history of knee pain, eczema, status post right shoulder arthroscopic surgery in 2018, who presents for follow-up consultation of her Parkinson's disease of several years duration.  The patient is unaccompanied today.  I first met her at the request of Dr. Rubin Payor, at which time she requested to transfer care.  She was diagnosed with Parkinson's disease in 2019 and was on levodopa therapy at the time as well as pramipexole and amantadine.  Today, 09/07/2023: She reports feeling fairly stable, sometimes first thing in the morning she feels off.  She is wondering if we can modify her medication regimen.  She generally takes her Sinemet IR 5 hourly starting at 7 AM.  She takes it 4 times a day with the last dose at 10 PM.  Bedtime is generally between 10:30 PM and 11 PM.  She has never tried Sinemet CR.  She continues to take amantadine twice daily, usually at 7 AM and 5 PM.  She takes pramipexole 0.5 mg 4 times a day, at the same times as the levodopa.  Thankfully, she has not fallen, she has tripped but no serious balance issues.  She has occasional swallowing difficulty but no choking events.  She tries to hydrate well.  She drinks caffeine in the form of coffee in the morning.  She tries to go to the gym at least 2 times a week and works on [employer] as well as yoga and cycling.  She has had no cognitive concerns.  She drives without problems.  She does not like to drive in the dark.  Previously:   03/02/23: (She) reports a several year history of incoordination and weakness on the right side as well as intermittent tremors.  She was diagnosed with Parkinson's disease in 2019.  She had seen Dr. Lucia Gaskins in our office in 2017 and had seen Dr. Alton Revere in November 2018.  She had an EMG and nerve conduction velocity test through  [facility] under Dr. Antonietta Barcelona in February 2019 with normal findings.    Her symptoms date back to 2017 when she developed intermittent tremors and slowness and stiffness affecting primarily her right upper extremity.  She has been on Sinemet and pramipexole for several years.  She was started on amantadine in November 2020.  She has been taking stable doses of amantadine 100 mg twice daily, pramipexole 0.5 mg 4 times daily, and generic Sinemet 25-100 mg strength 1 pill 4 times daily for the past few years.  She reports good results with her medications.   She was started on pramipexole in July 2019 when she saw Dr. Alton Revere.  She started Sinemet in June 2019.   I reviewed multiple records in her chart, she was last seen at [facility] on 09/09/2022 by Leane Call, PA.  She was advised to continue her medication regimen.  She requested a transfer of care as she retired and no longer works in [place].   She had a brain MRI without contrast through [facility] on 09/16/2017 and I reviewed the results: Impression: Normal MRI of the brain without contrast.  Incidental note of pansinusitis without air-fluid level.   She fell in October 2023 but did not have any head injury or loss of consciousness, no major injuries thankfully.  She has no significant constipation, no significant  cognitive complaints.  She exercises regularly, 3 to 4 times a week at the gym, she attends weightlifting and yoga and spinning classes.  She takes her amantadine at 7 AM and 5 PM, she takes her Sinemet and pramipexole at 7, 12, 5 PM and 10 PM daily.  She drives fairly well but tries to avoid bad weather or nighttime driving.  Her dad had Parkinson's disease and lived to be 72.  She is the middle of 5 girls altogether, 1 sister passed away from breast cancer, her younger sibling is a brother, none of her siblings have parkinsonism.  She lives with her husband, he has no children, they have 3 dogs in the  household.  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  She drinks quite a bit of caffeine in the form of coffee, about 4 cups/day and occasional green tea.  She drinks alcohol about 3-5 times a week, up to 2 drinks at a time.   Her Past Medical History Is Significant For: Past Medical History:  Diagnosis Date   Dermatitis    Eczema    Fibroid    Hemiparesis (HCC)    right side "part of the parkinson's" also had a muscle tear repair in R shoulder   Knee pain    Menopause     Her Past Surgical History Is Significant For: Past Surgical History:  Procedure Laterality Date   COLONOSCOPY     2021   CYSTOSCOPY     10/2019   SHOULDER ARTHROSCOPY Right    2018 W biceps tenodesis   TOTAL ABDOMINAL HYSTERECTOMY     10-29-2019   TRIGGER FINGER RELEASE Left    09/29/2020   tummy tuck     04-2023   WISDOM TOOTH EXTRACTION      Her Family History Is Significant For: Family History  Problem Relation Age of Onset   Neuropathy Mother    Dementia Mother    Depression Mother    Hypertension Mother    Restless legs syndrome Mother    Parkinson's disease Father    Heart disease Father    Breast cancer Sister    Atrial fibrillation Sister    Osteoporosis Sister    Heart disease Sister    Breast cancer Paternal Aunt    Breast cancer Paternal Grandmother    Heart disease Paternal Grandfather     Her Social History Is Significant For: Social History   Socioeconomic History   Marital status: Married    Spouse name: Not on file   Number of children: 0   Years of education: Assoc   Highest education level: Not on file  Occupational History   Occupation: [employer]  Tobacco Use   Smoking status: Never   Smokeless tobacco: Never  Vaping Use   Vaping status: Never Used  Substance and Sexual Activity   Alcohol use: Yes    Comment: occ; no more than 2 drinks ina day   Drug use: Never   Sexual activity: Not on file    Comment: TAH   Other Topics Concern   Not on file  Social History Narrative   Patient drinks 3-4 cups of coffee a day    Associates degree   Right handed   Goes to the gym for exercise 3-4 times per week   Social Determinants of Health   Financial Resource Strain: Low Risk  (03/08/2023)   Received from [facility], [facility]   Overall Financial Resource  Strain (CARDIA)    Difficulty of Paying Living Expenses: Not hard at all  Food Insecurity: No Food Insecurity (03/08/2023)   Received from [facility], [facility]   Hunger Vital Sign    Worried About Running Out of Food in the Last Year: Never true    Ran Out of Food in the Last Year: Never true  Transportation Needs: No Transportation Needs (03/08/2023)   [facility], [facility]   PRAPARE - Transportation    Lack of Transportation (Medical): No    Lack of Transportation (Non-Medical): No  Physical Activity: Insufficiently Active (08/10/2021)   Received from [facility] visits prior to 12/24/2022., [facility] visits prior to 12/24/2022.   Exercise Vital Sign    Days of Exercise per Week: 1 day    Minutes of Exercise per Session: 60 min  Stress: Stress Concern Present (08/10/2021)   Received from [facility] visits prior to 12/24/2022., [facility] visits prior to 12/24/2022.   [employer] of Occupational Health - Occupational Stress Questionnaire    Feeling of Stress : Rather much  Social Connections: Unknown (02/07/2023)   Received from [facility], [facility]   Social Network    Social Network: Not on file    Her Allergies Are:  No Known Allergies:   Her Current Medications Are:  Outpatient Encounter Medications as of 09/07/2023  Medication Sig   Amantadine HCl 100 MG tablet Take 100 mg by mouth 2 (two) times daily.   B Complex Vitamins (VITAMIN B COMPLEX) CAPS Take 1 capsule by mouth daily at 6 (six) AM.   Calcium  Carb-Cholecalciferol (CALCIUM 600/VITAMIN D3) 600-20 MG-MCG TABS Take by mouth 2 (two) times daily.   carbidopa-levodopa (SINEMET IR) 25-100 MG tablet Take 1 tablet by mouth 4 (four) times daily.   Cholecalciferol (VITAMIN D3) 1000 units CAPS Take 2,000 Units by mouth.   losartan (COZAAR) 25 MG tablet Take 25 mg by mouth daily.   Lutein 20 MG TABS Take 1 tablet by mouth daily after supper.   Multiple Vitamin (MULTIVITAMIN) tablet Take by mouth.   Omega-3 Fatty Acids (FISH OIL) 1000 MG CAPS Take by mouth.   pramipexole (MIRAPEX) 0.5 MG tablet Take 0.5 mg by mouth 4 (four) times daily.   TURMERIC PO Take 500 mg by mouth.   vitamin B-12 (CYANOCOBALAMIN) 500 MCG tablet Take 500 mcg by mouth daily.   Vitamin E 180 MG (400 UNIT) CAPS Take 180 mg by mouth.   No facility-administered encounter medications on file as of 09/07/2023.  :  Review of Systems:  Out of a complete 14 point review of systems, all are reviewed and negative with the exception of these symptoms as listed below:  Review of Systems  Neurological:        Pt doing ok.  There is gap from evening to am (effectiveness of sinemet).  Longer acting pm med?    Objective:  Neurological Exam  Physical Exam Physical Examination:   Vitals:   09/07/23 1302  BP: 106/72  Pulse: 75  SpO2: 98%    General Examination: The patient is a very pleasant 67 y.o. female in no acute distress. She appears well-developed and well-nourished and well groomed.   HEENT: Normocephalic, atraumatic, pupils are equal, round and reactive to light, extraocular tracking is good without limitation to gaze excursion or nystagmus noted. Hearing is grossly intact. Face is symmetric with very mild facial masking, speech  is clear with slight raspiness and very mild hypophonia but otherwise normal, no dysarthria, no voice tremor.  She has a very mild nuchal rigidity, good range of motion, no carotid bruits.  Airway examination reveals mild mouth dryness, otherwise  nonfocal findings.  Tongue protrudes centrally and palate elevates symmetrically.     Chest: Clear to auscultation without wheezing, rhonchi or crackles noted.   Heart: S1+S2+0, regular and normal without murmurs, rubs or gallops noted.    Abdomen: Soft, non-tender and non-distended.   Extremities: There is no pitting edema in the distal lower extremities bilaterally.    Skin: Warm and dry without trophic changes noted.    Musculoskeletal: exam reveals no obvious joint deformities.    Neurologically:  Mental status: The patient is awake, alert and oriented in all 4 spheres. Her immediate and remote memory, attention, language skills and fund of knowledge are good.  No evidence of any significant bradyphrenia, aphasia, agnosia, apraxia or anomia. Speech as above. Thought process is linear. Mood is normal and affect is normal.  Cranial nerves II - XII are as described above under HEENT exam.  Motor exam: Normal bulk, and strength noted, slight increase in tone in the right upper extremity, no obvious resting or postural or action tremor.   Mild intermittent generalized dyskinesias noted. Fine motor skills and coordination: Mild to moderately impaired with finger taps, hand movements and foot taps in the right upper extremity in the right lower extremity, better on the left altogether.    Cerebellar testing: No dysmetria or intention tremor. There is no truncal or gait ataxia.  Sensory exam: intact to light touch in the upper and lower extremities.  Gait, station and balance: She stands without differential occultly, posture is age-appropriate, mild decrease in arm swing on the right, otherwise no shuffling, mild dyskinesias while walking as well.  No sustained dystonic posturing.    Assessment and Plan:  In summary, Chisom Razza is a very pleasant 67 year old female with an underlying medical history of knee pain, eczema, status post right shoulder arthroscopic surgery in 2018, who presents  for follow-up consultation of her right sided parkinsonism with symptoms dating back to 2017 and diagnosis in 2019.  She has been stable since her last visit about 6 months ago.  She is advised to continue with immediate release Sinemet generic 1 pill 4 times a day but add a CR at bedtime, 50-200 mg strength.  She has had some slowness in the mornings.  Advised to continue with amantadine twice daily and pramipexole 4 times a day.  She is advised to stay well-hydrated and well rested and continue with her exercise regimen.  We talked about the importance of fall prevention and monitoring for cognitive issues.  She is advised to be cautious with her driving and proactive about constipation issues.  We will plan to follow-up in this clinic in about 6 months for her to see one of our nurse practitioners.  I answered all her questions today and she was in agreement.  I spent 30 minutes in total face-to-face time and in reviewing records during pre-charting, more than 50% of which was spent in counseling and coordination of care, reviewing test results, reviewing medications and treatment regimen and/or in discussing or reviewing the diagnosis of PD, the prognosis and treatment options. Pertinent laboratory and imaging test results that were available during this visit with the patient were reviewed by me and considered in my medical decision making (see chart for details).

## 2023-09-08 ENCOUNTER — Encounter: Payer: Self-pay | Admitting: Neurology

## 2023-10-09 ENCOUNTER — Ambulatory Visit
Admission: RE | Admit: 2023-10-09 | Discharge: 2023-10-09 | Disposition: A | Discharge status: Home / Self Care | Payer: Medicare Other | Source: Ambulatory Visit | Attending: Family Medicine | Admitting: Family Medicine

## 2023-10-09 DIAGNOSIS — Z1231 Encounter for screening mammogram for malignant neoplasm of breast: Secondary | ICD-10-CM

## 2023-10-11 ENCOUNTER — Other Ambulatory Visit: Payer: Self-pay | Admitting: Family Medicine

## 2023-10-11 DIAGNOSIS — R928 Other abnormal and inconclusive findings on diagnostic imaging of breast: Secondary | ICD-10-CM

## 2023-10-21 ENCOUNTER — Encounter: Payer: Self-pay | Admitting: Neurology

## 2023-10-30 ENCOUNTER — Encounter: Payer: Self-pay | Admitting: *Deleted

## 2023-10-30 NOTE — Telephone Encounter (Signed)
 Please put in letter form, addressed to her health insurance carrier.  She can pick up letter or get it through MyChart or let us  know where to fax it.   To whom it may concern,   Breanna Powers has Parkinson's disease and was diagnosed several years ago.  She has been reasonably stable on her current medication regimen, including pramipexole , amantadine , and generic Sinemet .  She was recently notified by her insurance carrier that amantadine  will no longer be covered under her Medicare drug coverage plan.  I would like for you to consider an exception in her case as amantadine  is a one-of-a-kind medication that is helpful in Parkinson's disease.  It does not have, as such, an alternative option, with the exception of Gocovri , which is a long-acting formulation of amantadine . Selegiline is not a good option for Parkinson's patients as it has a metabolite that is similar to a stimulant.  This can affect patient's sleep adversely and increase tremor and anxiety.   As it is, most patients with Parkinson's disease have limited options for medications that they can afford or tolerate.  Once we find a stable regimen, it is not to the patient's health benefit to change it, as even a minor change in medications can result in adverse effects or worsening symptoms including exacerbation of tremor, increased fall risk due to balance issues and sedation, increase in off-time, increase in involuntary movements, i.e. dyskinesias. Again, I would like for you to consider amantadine  to be covered for this patient so she can continue to benefit from her medications for symptomatic treatment of her Parkinson's disease.  I appreciate your concern for your member's health, quality of life, and long-term outcome.

## 2023-10-30 NOTE — Telephone Encounter (Signed)
 Letter emailed to pt at [email or web address]

## 2023-11-06 ENCOUNTER — Other Ambulatory Visit: Payer: Self-pay | Admitting: Neurology

## 2023-11-06 MED ORDER — AMANTADINE HCL 100 MG PO CAPS: 100.0000 mg | ORAL_CAPSULE | Freq: Two times a day (BID) | ORAL | 3 refills | Status: DC

## 2023-11-06 NOTE — Telephone Encounter (Signed)
 Dr Frances Furbish already prescribed

## 2023-11-06 NOTE — Addendum Note (Signed)
 Addended by: Huston Foley on: 11/06/2023 08:57 AM   Modules accepted: Orders

## 2023-11-06 NOTE — Telephone Encounter (Signed)
 Rx sent to pharmacy for amantadine 100 caps, 1 bid.

## 2023-11-10 ENCOUNTER — Ambulatory Visit: Payer: PRIVATE HEALTH INSURANCE

## 2023-11-10 ENCOUNTER — Ambulatory Visit
Admission: RE | Admit: 2023-11-10 | Discharge: 2023-11-10 | Disposition: A | Discharge status: Home / Self Care | Payer: PRIVATE HEALTH INSURANCE | Source: Ambulatory Visit | Attending: Family Medicine | Admitting: Family Medicine

## 2023-11-10 ENCOUNTER — Other Ambulatory Visit: Payer: Self-pay | Admitting: Family Medicine

## 2023-11-10 DIAGNOSIS — N6325 Unspecified lump in the left breast, overlapping quadrants: Secondary | ICD-10-CM | POA: Diagnosis not present

## 2023-11-10 DIAGNOSIS — R928 Other abnormal and inconclusive findings on diagnostic imaging of breast: Secondary | ICD-10-CM

## 2023-11-10 DIAGNOSIS — N6489 Other specified disorders of breast: Secondary | ICD-10-CM | POA: Diagnosis not present

## 2023-11-10 DIAGNOSIS — N6320 Unspecified lump in the left breast, unspecified quadrant: Secondary | ICD-10-CM

## 2023-11-14 ENCOUNTER — Encounter: Admitting: Adult Health

## 2023-11-14 ENCOUNTER — Ambulatory Visit
Admission: RE | Admit: 2023-11-14 | Discharge: 2023-11-14 | Disposition: A | Discharge status: Home / Self Care | Payer: Medicare Other | Source: Ambulatory Visit | Attending: Family Medicine | Admitting: Family Medicine

## 2023-11-14 ENCOUNTER — Ambulatory Visit
Admission: RE | Admit: 2023-11-14 | Discharge: 2023-11-14 | Discharge status: Home / Self Care | Payer: Medicare Other | Source: Ambulatory Visit | Attending: Family Medicine | Admitting: Family Medicine

## 2023-11-14 DIAGNOSIS — C50812 Malignant neoplasm of overlapping sites of left female breast: Secondary | ICD-10-CM | POA: Diagnosis not present

## 2023-11-14 DIAGNOSIS — N6325 Unspecified lump in the left breast, overlapping quadrants: Secondary | ICD-10-CM | POA: Diagnosis not present

## 2023-11-14 DIAGNOSIS — N6320 Unspecified lump in the left breast, unspecified quadrant: Secondary | ICD-10-CM

## 2023-11-14 HISTORY — PX: BREAST BIOPSY: SHX20

## 2023-11-15 LAB — SURGICAL PATHOLOGY

## 2023-11-16 ENCOUNTER — Telehealth: Payer: Self-pay | Admitting: *Deleted

## 2023-11-16 NOTE — Telephone Encounter (Signed)
LVM for patient to return my call in reference to [facility] on 1/29 arrival time of 830, left my contact for patient to return my call

## 2023-11-20 ENCOUNTER — Encounter: Payer: Self-pay | Admitting: Radiation Oncology

## 2023-11-20 ENCOUNTER — Encounter: Payer: Self-pay | Admitting: *Deleted

## 2023-11-20 ENCOUNTER — Other Ambulatory Visit: Payer: Self-pay | Admitting: Neurology

## 2023-11-20 DIAGNOSIS — Z170 Estrogen receptor positive status [ER+]: Secondary | ICD-10-CM | POA: Insufficient documentation

## 2023-11-21 ENCOUNTER — Other Ambulatory Visit: Payer: Self-pay | Admitting: General Surgery

## 2023-11-21 DIAGNOSIS — C50212 Malignant neoplasm of upper-inner quadrant of left female breast: Secondary | ICD-10-CM

## 2023-11-21 NOTE — Telephone Encounter (Signed)
Pt has 6 mo appt 03-14-2024, last seen 09-07-2023. We have not prescribed previously, but increased the dose sinemet 25/1001 tab po qid.

## 2023-11-21 NOTE — Progress Notes (Signed)
Radiation Oncology         ([phone]) [phone] ________________________________  Initial Outpatient Consultation  Name: Breanna Powers MRN: [id number]  Date: 11/22/2023  DOB: [birth year]  CC:Breanna Soho, PA-C  Almond Lint, MD   REFERRING PHYSICIAN: Almond Lint, MD  DIAGNOSIS: No diagnosis found.   Cancer Staging  No matching staging information was found for the patient.  Stage *** Left Breast UIQ, Invasive Ductal Carcinoma, ER+ / PR+ / Her2-, Grade 2  CHIEF COMPLAINT: Here to discuss management of left breast cancer  HISTORY OF PRESENT ILLNESS::Breanna Powers is a 68 y.o. female who presented with bilateral breast abnormalities on the following imaging: bilateral screening mammogram on the date of 10/09/23. No symptoms, if any, were reported at that time. She accordingly presented for bilateral diagnostic mammogram and left breast ultrasound on 11/10/23 which further demonstrated: a 5 mm mass in the 9 o'clock left breast, located 6 cmfn. There was no diagnostic mammographic correlate for the previously demonstrated right breast asymmetry on her screening mammogram, and no evidence of left axillary lymphadenopathy was demonstrated sonographically.   Biopsy of the 9 o'clock left breast mass on date of 11/14/23 showed grade 2 invasive ductal carcinoma measuring 6 mm in the greatest linear extent of the sample.  ER status: 95% positive with strong staining intensity; PR status 60% positive with weak-moderate staining intensity; Proliferation marker Ki67 at 10%; Her2 status negative; Grade 2. No lymph nodes were examined.   ***  PREVIOUS RADIATION THERAPY: No  PAST MEDICAL HISTORY:  has a past medical history of Dermatitis, Eczema, Fibroid, Hemiparesis (HCC), Knee pain, and Menopause.    PAST SURGICAL HISTORY: Past Surgical History:  Procedure Laterality Date   BREAST BIOPSY Left 11/14/2023   [place] LT BREAST BX W LOC DEV 1ST LESION IMG BX SPEC US GUIDE 11/14/2023 [facility]    COLONOSCOPY     2021   CYSTOSCOPY     10/2019   SHOULDER ARTHROSCOPY Right    2018 W biceps tenodesis   TOTAL ABDOMINAL HYSTERECTOMY     10-29-2019   TRIGGER FINGER RELEASE Left    09/29/2020   tummy tuck     04-2023   WISDOM TOOTH EXTRACTION      FAMILY HISTORY: family history includes Atrial fibrillation in her sister; Breast cancer in her paternal aunt, paternal grandmother, and sister; Dementia in her mother; Depression in her mother; Heart disease in her father, paternal grandfather, and sister; Hypertension in her mother; Neuropathy in her mother; Osteoporosis in her sister; Parkinson's disease in her father; Restless legs syndrome in her mother.  SOCIAL HISTORY:  reports that she has never smoked. She has never used smokeless tobacco. She reports current alcohol use. She reports that she does not use drugs.  ALLERGIES: Patient has no known allergies.  MEDICATIONS:  Current Outpatient Medications  Medication Sig Dispense Refill   amantadine (SYMMETREL) 100 MG capsule Take 1 capsule (100 mg total) by mouth 2 (two) times daily. 60 capsule 3   B Complex Vitamins (VITAMIN B COMPLEX) CAPS Take 1 capsule by mouth daily at 6 (six) AM.     Calcium Carb-Cholecalciferol (CALCIUM 600/VITAMIN D3) 600-20 MG-MCG TABS Take by mouth 2 (two) times daily.     carbidopa-levodopa (SINEMET CR) 50-200 MG tablet Take 1 tablet by mouth at bedtime. 30 tablet 5   carbidopa-levodopa (SINEMET IR) 25-100 MG tablet TAKE 1 TABLET FOUR TIMES A DAY 360 tablet 3   Cholecalciferol (VITAMIN D3) 1000 units CAPS Take 2,000 Units by mouth.  losartan (COZAAR) 25 MG tablet Take 25 mg by mouth daily.     Lutein 20 MG TABS Take 1 tablet by mouth daily after supper.     Multiple Vitamin (MULTIVITAMIN) tablet Take by mouth.     Omega-3 Fatty Acids (FISH OIL) 1000 MG CAPS Take by mouth.     pramipexole (MIRAPEX) 0.5 MG tablet Take 0.5 mg by mouth 4 (four) times daily.     TURMERIC PO Take 500 mg by mouth.     vitamin B-12  (CYANOCOBALAMIN) 500 MCG tablet Take 500 mcg by mouth daily.     Vitamin E 180 MG (400 UNIT) CAPS Take 180 mg by mouth.     No current facility-administered medications for this encounter.    REVIEW OF SYSTEMS: As above in HPI.   PHYSICAL EXAM:  vitals were not taken for this visit.   General: Alert and oriented, in no acute distress HEENT: Head is normocephalic. Extraocular movements are intact. Oropharynx is clear. Neck: Neck is supple, no palpable cervical or supraclavicular lymphadenopathy. Heart: Regular in rate and rhythm with no murmurs, rubs, or gallops. Chest: Clear to auscultation bilaterally, with no rhonchi, wheezes, or rales. Abdomen: Soft, nontender, nondistended, with no rigidity or guarding. Extremities: No cyanosis or edema. Lymphatics: see Neck Exam Skin: No concerning lesions. Musculoskeletal: symmetric strength and muscle tone throughout. Neurologic: Cranial nerves II through XII are grossly intact. No obvious focalities. Speech is fluent. Coordination is intact. Psychiatric: Judgment and insight are intact. Affect is appropriate. Breasts: *** . No other palpable masses appreciated in the breasts or axillae *** .    ECOG = ***  0 - Asymptomatic (Fully active, able to carry on all predisease activities without restriction)  1 - Symptomatic but completely ambulatory (Restricted in physically strenuous activity but ambulatory and able to carry out work of a light or sedentary nature. For example, light housework, office work)  2 - Symptomatic, <50% in bed during the day (Ambulatory and capable of all self care but unable to carry out any work activities. Up and about more than 50% of waking hours)  3 - Symptomatic, >50% in bed, but not bedbound (Capable of only limited self-care, confined to bed or chair 50% or more of waking hours)  4 - Bedbound (Completely disabled. Cannot carry on any self-care. Totally confined to bed or chair)  5 - Death   Santiago Glad MM, Creech  RH, Tormey DC, et al. [phone]). "Toxicity and response criteria of the [facility]". Am. Evlyn Clines. Oncol. 5 (6): 649-55   LABORATORY DATA:  No results found for: "WBC", "HGB", "HCT", "MCV", "PLT" CMP  No results found for: "NA", "K", "CL", "CO2", "GLUCOSE", "BUN", "CREATININE", "CALCIUM", "PROT", "ALBUMIN", "AST", "ALT", "ALKPHOS", "BILITOT", "GFRNONAA", "GFRAA"       RADIOGRAPHY: [place] LT BREAST BX W LOC DEV 1ST LESION IMG BX SPEC US GUIDE Addendum Date: 11/18/2023 ADDENDUM REPORT: 11/18/2023 11:44 ADDENDUM: Pathology revealed GRADE II INVASIVE DUCTAL CARCINOMA of the LEFT breast, 9 o'clock, 6cmfn, (coil clip). This was found to be concordant by Dr. Annia Belt. Pathology results were discussed with the patient and her husband by telephone. The patient reported doing well after the biopsy with tenderness and bruising at the site. Post biopsy instructions and care were reviewed and questions were answered. The patient was encouraged to call [facility] for any additional concerns. My direct phone number was provided. The patient was referred to [facility] on November 22, 2023. Pathology results reported by Rene Kocher, RN on 11/15/2023. Electronically Signed   By: Annia Belt M.D.   On: 11/18/2023 11:44   Result Date: 11/18/2023 CLINICAL DATA:  Suspicious left breast mass EXAM: ULTRASOUND GUIDED LEFT BREAST CORE NEEDLE BIOPSY COMPARISON:  Previous exam(s). PROCEDURE: I met with the patient and we discussed the procedure of ultrasound-guided biopsy, including benefits and alternatives. We discussed the high likelihood of a successful procedure. We discussed the risks of the procedure, including infection, bleeding, tissue injury, clip migration, and inadequate sampling. Informed written consent was given. The usual time-out protocol was performed immediately prior to the procedure.  Lesion quadrant: Upper inner quadrant Using sterile technique and 1% Lidocaine as local anesthetic, under direct ultrasound visualization, a 14 gauge spring-loaded device was used to perform biopsy of left breast mass 9 o'clock position using a medial approach. At the conclusion of the procedure coil shaped tissue marker clip was deployed into the biopsy cavity. Follow up 2 view mammogram was performed and dictated separately. IMPRESSION: Ultrasound guided biopsy of left breast mass 9 o'clock position. No apparent complications. Electronically Signed: By: Annia Belt M.D. On: 11/14/2023 08:50   MM CLIP PLACEMENT LEFT Result Date: 11/14/2023 CLINICAL DATA:  Suspicious left breast mass EXAM: 3D DIAGNOSTIC LEFT MAMMOGRAM POST ULTRASOUND BIOPSY COMPARISON:  Previous exam(s). FINDINGS: 3D Mammographic images were obtained following ultrasound guided biopsy of left breast mass 9 o'clock position. The biopsy marking clip is in expected position at the site of biopsy. IMPRESSION: Appropriate positioning of the coil shaped biopsy marking clip at the site of biopsy in the left breast mass 9 o'clock position. Final Assessment: Post Procedure Mammograms for Marker Placement Electronically Signed   By: Annia Belt M.D.   On: 11/14/2023 08:52   MM 3D DIAGNOSTIC MAMMOGRAM BILATERAL BREAST Result Date: 11/10/2023 CLINICAL DATA:  Patient recalled from screening for right breast asymmetry and left breast mass. EXAM: DIGITAL DIAGNOSTIC BILATERAL MAMMOGRAM WITH TOMOSYNTHESIS AND CAD; ULTRASOUND LEFT BREAST LIMITED TECHNIQUE: Bilateral digital diagnostic mammography and breast tomosynthesis was performed. The images were evaluated with computer-aided detection. ; Targeted ultrasound examination of the left breast was performed. COMPARISON:  Previous exam(s). ACR Breast Density Category c: The breasts are heterogeneously dense, which may obscure small masses. FINDINGS: Within the posteromedial left breast there is a small lobular  spiculated mass. This is further evaluated with spot compression views. Questioned asymmetry right breast resolved with additional imaging compatible with dense overlapping fibroglandular tissue. Targeted ultrasound is performed, showing a 5 x 4 x 4 mm irregular hypoechoic mass left breast 9 o'clock position 6 cm from the nipple. No left axillary adenopathy. IMPRESSION: Suspicious left breast mass 9 o'clock position. RECOMMENDATION: Ultrasound-guided core needle biopsy left breast mass 9 o'clock position. I have discussed the findings and recommendations with the patient. If applicable, a reminder letter will be sent to the patient regarding the next appointment. BI-RADS CATEGORY  5: Highly suggestive of malignancy. Electronically Signed   By: Annia Belt M.D.   On: 11/10/2023 12:03   [place] LIMITED ULTRASOUND INCLUDING AXILLA LEFT BREAST  Result Date: 11/10/2023 CLINICAL DATA:  Patient recalled from screening for right breast asymmetry and left breast mass. EXAM: DIGITAL DIAGNOSTIC BILATERAL MAMMOGRAM WITH TOMOSYNTHESIS AND CAD; ULTRASOUND LEFT BREAST LIMITED TECHNIQUE: Bilateral digital diagnostic mammography and breast tomosynthesis was performed. The images were evaluated with computer-aided detection. ; Targeted ultrasound examination of the left breast was performed. COMPARISON:  Previous exam(s). ACR Breast Density  Category c: The breasts are heterogeneously dense, which may obscure small masses. FINDINGS: Within the posteromedial left breast there is a small lobular spiculated mass. This is further evaluated with spot compression views. Questioned asymmetry right breast resolved with additional imaging compatible with dense overlapping fibroglandular tissue. Targeted ultrasound is performed, showing a 5 x 4 x 4 mm irregular hypoechoic mass left breast 9 o'clock position 6 cm from the nipple. No left axillary adenopathy. IMPRESSION: Suspicious left breast mass 9 o'clock position. RECOMMENDATION:  Ultrasound-guided core needle biopsy left breast mass 9 o'clock position. I have discussed the findings and recommendations with the patient. If applicable, a reminder letter will be sent to the patient regarding the next appointment. BI-RADS CATEGORY  5: Highly suggestive of malignancy. Electronically Signed   By: Annia Belt M.D.   On: 11/10/2023 12:03      IMPRESSION/PLAN: ***   It was a pleasure meeting the patient today. We discussed the risks, benefits, and side effects of radiotherapy. I recommend radiotherapy to the *** to reduce her risk of locoregional recurrence by 2/3.  We discussed that radiation would take approximately *** weeks to complete and that I would give the patient a few weeks to heal following surgery before starting treatment planning. *** If chemotherapy were to be given, this would precede radiotherapy. We spoke about acute effects including skin irritation and fatigue as well as much less common late effects including internal organ injury or irritation. We spoke about the latest technology that is used to minimize the risk of late effects for patients undergoing radiotherapy to the breast or chest wall. No guarantees of treatment were given. The patient is enthusiastic about proceeding with treatment. I look forward to participating in the patient's care.  I will await her referral back to me for postoperative follow-up and eventual CT simulation/treatment planning.  On date of service, in total, I spent *** minutes on this encounter. Patient was seen in person.   __________________________________________   Lonie Peak, MD  This document serves as a record of services personally performed by Lonie Peak, MD. It was created on her behalf by Neena Rhymes, a trained medical scribe. The creation of this record is based on the scribe's personal observations and the provider's statements to them. This document has been checked and approved by the attending provider.

## 2023-11-22 ENCOUNTER — Inpatient Hospital Stay (HOSPITAL_BASED_OUTPATIENT_CLINIC_OR_DEPARTMENT_OTHER): Payer: Medicare Other | Admitting: Hematology and Oncology

## 2023-11-22 ENCOUNTER — Ambulatory Visit: Payer: Medicare Other | Attending: General Surgery | Admitting: Physical Therapy

## 2023-11-22 ENCOUNTER — Ambulatory Visit
Admission: RE | Admit: 2023-11-22 | Discharge: 2023-11-22 | Disposition: A | Discharge status: Home / Self Care | Payer: Medicare Other | Source: Ambulatory Visit | Attending: Radiation Oncology | Admitting: Radiation Oncology

## 2023-11-22 ENCOUNTER — Encounter: Payer: Self-pay | Admitting: *Deleted

## 2023-11-22 ENCOUNTER — Inpatient Hospital Stay: Payer: Medicare Other | Attending: Hematology and Oncology | Admitting: Hematology and Oncology

## 2023-11-22 ENCOUNTER — Inpatient Hospital Stay: Payer: Medicare Other | Admitting: Licensed Clinical Social Worker

## 2023-11-22 ENCOUNTER — Inpatient Hospital Stay: Payer: Medicare Other | Admitting: Genetic Counselor

## 2023-11-22 ENCOUNTER — Other Ambulatory Visit: Payer: Self-pay

## 2023-11-22 ENCOUNTER — Encounter: Payer: Self-pay | Admitting: Radiation Oncology

## 2023-11-22 ENCOUNTER — Encounter: Payer: Self-pay | Admitting: Genetic Counselor

## 2023-11-22 ENCOUNTER — Encounter: Payer: Self-pay | Admitting: Physical Therapy

## 2023-11-22 VITALS — BP 139/70 | HR 83 | Temp 97.7°F | Resp 16 | Ht 62.99 in | Wt 129.9 lb

## 2023-11-22 DIAGNOSIS — C50212 Malignant neoplasm of upper-inner quadrant of left female breast: Secondary | ICD-10-CM

## 2023-11-22 DIAGNOSIS — M25611 Stiffness of right shoulder, not elsewhere classified: Secondary | ICD-10-CM | POA: Insufficient documentation

## 2023-11-22 DIAGNOSIS — G20B1 Parkinson's disease with dyskinesia, without mention of fluctuations: Secondary | ICD-10-CM | POA: Diagnosis not present

## 2023-11-22 DIAGNOSIS — Z79899 Other long term (current) drug therapy: Secondary | ICD-10-CM

## 2023-11-22 DIAGNOSIS — Z170 Estrogen receptor positive status [ER+]: Secondary | ICD-10-CM | POA: Diagnosis not present

## 2023-11-22 DIAGNOSIS — Z803 Family history of malignant neoplasm of breast: Secondary | ICD-10-CM | POA: Diagnosis not present

## 2023-11-22 DIAGNOSIS — I10 Essential (primary) hypertension: Secondary | ICD-10-CM

## 2023-11-22 DIAGNOSIS — R293 Abnormal posture: Secondary | ICD-10-CM | POA: Diagnosis not present

## 2023-11-22 DIAGNOSIS — Z809 Family history of malignant neoplasm, unspecified: Secondary | ICD-10-CM | POA: Diagnosis not present

## 2023-11-22 LAB — CBC WITH DIFFERENTIAL (CANCER CENTER ONLY)
Abs Immature Granulocytes: 0.02 10*3/uL (ref 0.00–0.07)
Basophils Absolute: 0 10*3/uL (ref 0.0–0.1)
Basophils Relative: 1 %
Eosinophils Absolute: 0.2 10*3/uL (ref 0.0–0.5)
Eosinophils Relative: 4 %
HCT: 39.1 % (ref 36.0–46.0)
Hemoglobin: 13.2 g/dL (ref 12.0–15.0)
Immature Granulocytes: 0 %
Lymphocytes Relative: 27 %
Lymphs Abs: 1.7 10*3/uL (ref 0.7–4.0)
MCH: 32.3 pg (ref 26.0–34.0)
MCHC: 33.8 g/dL (ref 30.0–36.0)
MCV: 95.6 fL (ref 80.0–100.0)
Monocytes Absolute: 0.4 10*3/uL (ref 0.1–1.0)
Monocytes Relative: 6 %
Neutro Abs: 4 10*3/uL (ref 1.7–7.7)
Neutrophils Relative %: 62 %
Platelet Count: 247 10*3/uL (ref 150–400)
RBC: 4.09 MIL/uL (ref 3.87–5.11)
RDW: 11.9 % (ref 11.5–15.5)
WBC Count: 6.4 10*3/uL (ref 4.0–10.5)
nRBC: 0 % (ref 0.0–0.2)

## 2023-11-22 LAB — CMP (CANCER CENTER ONLY)
ALT: <5 U/L (ref 0–44)
AST: 16 U/L (ref 15–41)
Albumin: 4 g/dL (ref 3.5–5.0)
Alkaline Phosphatase: 73 U/L (ref 38–126)
Anion gap: 4 — ABNORMAL LOW (ref 5–15)
BUN: 17 mg/dL (ref 8–23)
CO2: 29 mmol/L (ref 22–32)
Calcium: 9.8 mg/dL (ref 8.9–10.3)
Chloride: 106 mmol/L (ref 98–111)
Creatinine: 0.91 mg/dL (ref 0.44–1.00)
GFR, Estimated: >60 mL/min (ref >60)
Glucose, Bld: 87 mg/dL (ref 70–99)
Potassium: 4.5 mmol/L (ref 3.5–5.1)
Sodium: 139 mmol/L (ref 135–145)
Total Bilirubin: 0.5 mg/dL (ref 0.0–1.2)
Total Protein: 6.5 g/dL (ref 6.5–8.1)

## 2023-11-22 LAB — GENETIC SCREENING ORDER

## 2023-11-22 NOTE — Progress Notes (Signed)
[facility] Clinical Social Work  Initial Assessment   Breanna Powers is a 68 y.o. year old female accompanied by spouse, Breanna Powers. Clinical Social Work was referred by  [facility]  for assessment of psychosocial needs.   SDOH (Social Determinants of Health) assessments performed: Yes SDOH Interventions    Flowsheet Row Clinical Support from 11/22/2023 in [facility] - [facility] Of [place]. [facility]  SDOH Interventions   Food Insecurity Interventions Intervention Not Indicated  Housing Interventions Intervention Not Indicated  Transportation Interventions Intervention Not Indicated  Utilities Interventions Intervention Not Indicated       SDOH Screenings   Food Insecurity: No Food Insecurity (11/22/2023)  Housing: Low Risk  (11/22/2023)  Transportation Needs: No Transportation Needs (11/22/2023)  Utilities: Not At Risk (11/22/2023)  Depression (PHQ2-9): Low Risk  (11/22/2023)  Financial Resource Strain: Low Risk  (03/08/2023)   Received from [facility], [facility]  Physical Activity: Insufficiently Active (08/10/2021)   Received from [facility] visits prior to 12/24/2022., [facility] visits prior to 12/24/2022.  Social Connections: Unknown (02/07/2023)   Received from [facility], [facility]  Stress: Stress Concern Present (08/10/2021)   [facility] from [facility] visits prior to 12/24/2022., [facility] visits prior to 12/24/2022.  Tobacco Use: Low Risk  (11/22/2023)     Distress Screen completed: No     No data to display            Family/Social Information:  Housing Arrangement: patient lives with husband, Breanna Powers  and their 3 dogs Family members/support persons in your life? Family and Friends Transportation concerns: no  Employment: Retired.  Income source: Actor concerns: No Type of concern: None Food access concerns: no Religious or  spiritual practice: Not known Advanced directives: Yes-pt reported has documents Services Currently in place:  [employer]  Coping/ Adjustment to diagnosis: Patient understands treatment plan and what happens next? yes, feels confident in dealing with cancer diagnosis. Viewing it as more of an aggravation in comparison to her Parkinson's diagnosis Concerns about diagnosis and/or treatment: I'm not especially worried about anything Patient reported stressors:  Parkinson's disease Patient enjoys  being active (walking trails, gym, yoga), gardening, crafts, puzzles, reading, time with family and friends Current coping skills/ strengths: Ability for insight , Active sense of humor , Capable of independent living , Communication skills , Special hobby/interest , and Supportive family/friends     SUMMARY: Current SDOH Barriers:  No major barriers identified at this time  Clinical Social Work Clinical Goal(s):  No clinical social work goals at this time  Interventions: Discussed common feeling and emotions when being diagnosed with cancer, and the importance of support during treatment Informed patient of the support team roles and support services at [facility] Provided CSW contact information and encouraged patient to call with any questions or concerns   Follow Up Plan: Patient will contact CSW with any support or resource needs Patient verbalizes understanding of plan: Yes    Marion Seese E Siaosi Alter, LCSW Clinical Social Worker [facility]

## 2023-11-22 NOTE — Assessment & Plan Note (Signed)
This is a very pleasant 68 year old postmenopausal female patient with newly diagnosed left breast cancer referred to breast [facility] for new diagnosis of left breast grade 2 IDC.  Small (5mm) estrogen and progesterone positive, HER2 negative tumor. Discussed the plan for lumpectomy, radiation, and antiestrogen therapy. Discussed the following details about oncotype.  We have discussed about Oncotype Dx score which is a well validated prognostic scoring system which can predict outcome with endocrine therapy alone and whether chemotherapy reduces recurrence.  Typically in patients with ER positive cancers that are node negative if the RS score is high typically greater than or equal to 26, chemotherapy is recommended.  -Plan for lumpectomy and radiation therapy. -Perform Oncotype test on tumor tissue post-lumpectomy. -Start antiestrogen therapy (Tamoxifen or Anastrozole/Letrozole) post-radiation.  Genetic Testing Discussed the plan for genetic testing. -Perform genetic testing.  Research Participation Patient expressed interest in participating in research studies. -Enroll patient in [employer] and blood collection studies.

## 2023-11-22 NOTE — Research (Signed)
Trial:  [employer] 2021-05 - Specimen Collection Study to Evaluate Biomarkers in Subjects with Cancer   Patient Breanna Powers was identified by Dr. Al Pimple as a potential candidate for the above listed study.  This Clinical Research Coordinator met with Breanna Powers, [id number], on 11/22/23 in a manner and location that ensures patient privacy to discuss participation in the above listed research study.  Patient is Accompanied by husband .  A copy of the informed consent document with embedded HIPAA language was provided to the patient.  Patient reads, speaks, and understands [place].   Patient was provided with the business card of Domenica Reamer, RN and encouraged to contact the research team with any questions.  Approximately 10 minutes were spent with the patient reviewing the informed consent documents.  Patient was provided the option of taking informed consent documents home to review and was encouraged to review at their convenience with their support network, including other care providers. Patient took the consent documents home to review. Patient agreed to follow up call Monday, Feb. 2nd. Cooper Render, MPH  Clinical Research Coordinator

## 2023-11-22 NOTE — Progress Notes (Signed)
[facility] CONSULT NOTE  Patient Care Team: Jarrett Soho, PA-C as PCP - General (Family Medicine) Pershing Proud, RN as Oncology Nurse Navigator Donnelly Angelica, RN as Oncology Nurse Navigator Rachel Moulds, MD as Consulting Physician (Hematology and Oncology)  CHIEF COMPLAINTS/PURPOSE OF CONSULTATION:  Newly diagnosed breast cancer  HISTORY OF PRESENTING ILLNESS:  Breanna Powers 68 y.o. female is here because of recent diagnosis of left breast cancer.  I reviewed her records extensively and collaborated the history with the patient.  SUMMARY OF ONCOLOGIC HISTORY: Oncology History  Malignant neoplasm of upper-inner quadrant of left breast in female, estrogen receptor positive (HCC)  10/09/2023 Mammogram   Mammogram showed possible asymmetry in the right breast and left breast hence diagnostic mammogram and ultrasound were recommended.  Diagnostic mammogram once again confirmed a 5 x 4 x 4 mm irregular hypoechoic mass left breast at 9 o'clock position 6 cm from the nipple.  No left axillary adenopathy.  Questioned asymmetry in the right breast resolved with additional imaging compatible with dense overlapping fibroglandular tissue.   11/14/2023 Pathology Results   Left breast needle core biopsy at 9:00 showed invasive ductal carcinoma, overall grade 2, prognostics showed ER 95% positive strong staining PR 60% positive weak to moderate staining, Ki-67 of 10% and HER2 1+.   11/20/2023 Initial Diagnosis   Malignant neoplasm of upper-inner quadrant of left breast in female, estrogen receptor positive (HCC)   11/22/2023 Cancer Staging   Staging form: Breast, AJCC 8th Edition - Pathologic stage from 11/22/2023: Stage IA (pT1a, pN0, cM0, G2, ER+, PR+, HER2-) - Signed by Rachel Moulds, MD on 11/22/2023 Stage prefix: Initial diagnosis Histologic grading system: 3 grade system Laterality: Left Stage used in treatment planning: Yes National guidelines used in treatment  planning: Yes Type of national guideline used in treatment planning: NCCN    Patient arrived to the appointment today with her husband. Discussed the use of AI scribe software for clinical note transcription with the patient, who gave verbal consent to proceed.  History of Present Illness    She has been diagnosed with an early, estrogen receptor-positive, progesterone receptor-positive, HER2-negative breast cancer in the left breast, measuring approximately five millimeters.  She has a history of Parkinson's disease, for which she is taking carbidopa-levodopa and amantadine. Pramipexole is also used for both Parkinson's and restless legs syndrome. Additionally, she is on losartan for hypertension and takes vitamins and turmeric supplements.  Her family history is significant for breast cancer on her father's side, with her paternal aunt having had the disease. She has undergone a complete hysterectomy many yrs ago.  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 She resides in [place].  Rest of the pertinent 10 point ROS reviewed and neg.  MEDICAL HISTORY:  Past Medical History:  Diagnosis Date   Dermatitis    Eczema    Fibroid    Hemiparesis (HCC)    right side "part of the parkinson's" also had a muscle tear repair in R shoulder   Hypertension    Knee pain    Menopause    Parkinson disease (HCC)     SURGICAL HISTORY: Past Surgical History:  Procedure Laterality Date   BREAST BIOPSY Left 11/14/2023   [place] LT BREAST BX W LOC DEV 1ST LESION IMG BX SPEC US GUIDE 11/14/2023 [facility]MO[facility]   COLONOSCOPY     2021   CYSTOSCOPY     10/2019   SHOULDER ARTHROSCOPY Right  2018 W biceps tenodesis   TOTAL ABDOMINAL HYSTERECTOMY     10-29-2019   TRIGGER FINGER RELEASE Left    09/29/2020   tummy tuck     04-2023   WISDOM TOOTH EXTRACTION      SOCIAL HISTORY: Social History   Socioeconomic  History   Marital status: Married    Spouse name: Not on file   Number of children: 0   Years of education: Assoc   Highest education level: Not on file  Occupational History   Occupation: [employer]  Tobacco Use   Smoking status: Never   Smokeless tobacco: Never  Vaping Use   Vaping status: Never Used  Substance and Sexual Activity   Alcohol use: Yes    Comment: occ; no more than 2 drinks ina day   Drug use: Never   Sexual activity: Not on file    Comment: TAH  Other Topics Concern   Not on file  Social History Narrative   Patient drinks 3-4 cups of coffee a day    Associates degree   Right handed   Goes to the gym for exercise 3-4 times per week   Social Drivers of Health   Financial Resource Strain: Low Risk  (03/08/2023)   Received from [facility], [facility]   Overall Financial Resource Strain (CARDIA)    Difficulty of Paying Living Expenses: Not hard at all  Food Insecurity: No Food Insecurity (03/08/2023)   Received from [facility], [facility]   Hunger Vital Sign    Worried About Running Out of Food in the Last Year: Never true    Ran Out of Food in the Last Year: Never true  Transportation Needs: No Transportation Needs (03/08/2023)   Received from [employer], [facility]   PRAPARE - Transportation    Lack of Transportation (Medical): No    Lack of Transportation (Non-Medical): No  Physical Activity: Insufficiently Active (08/10/2021)   Received from [facility] visits prior to 12/24/2022., [facility] visits prior to 12/24/2022.   Exercise Vital Sign    Days of Exercise per Week: 1 day    Minutes of Exercise per Session: 60 min  Stress: Stress Concern Present (08/10/2021)   Received from [facility] visits prior to 12/24/2022., [facility] visits prior to 12/24/2022.   [employer] of Occupational Health - Occupational Stress Questionnaire    Feeling of  Stress : Rather much  Social Connections: Unknown (02/07/2023)   Received from [facility], [facility]   Social Network    Social Network: Not on file  Intimate Partner Violence: Unknown (02/07/2023)   Received from [facility], [facility]   HITS    Physically Hurt: Not on file    Insult or Talk Down To: Not on file    Threaten Physical Harm: Not on file    Scream or Curse: Not on file    FAMILY HISTORY: Family History  Problem Relation Age of Onset   Neuropathy Mother    Dementia Mother    Depression Mother    Hypertension Mother    Restless legs syndrome Mother    Parkinson's disease Father    Heart disease Father    Breast cancer Sister    Atrial fibrillation Sister    Osteoporosis Sister    Heart disease Sister    Breast cancer Paternal Aunt    Breast cancer Paternal Grandmother    Heart disease Paternal Grandfather  ALLERGIES:  has no known allergies.  MEDICATIONS:  Current Outpatient Medications  Medication Sig Dispense Refill   amantadine (SYMMETREL) 100 MG capsule Take 1 capsule (100 mg total) by mouth 2 (two) times daily. 60 capsule 3   B Complex Vitamins (VITAMIN B COMPLEX) CAPS Take 1 capsule by mouth daily at 6 (six) AM.     Calcium Carb-Cholecalciferol (CALCIUM 600/VITAMIN D3) 600-20 MG-MCG TABS Take by mouth 2 (two) times daily.     carbidopa-levodopa (SINEMET CR) 50-200 MG tablet Take 1 tablet by mouth at bedtime. 30 tablet 5   carbidopa-levodopa (SINEMET IR) 25-100 MG tablet TAKE 1 TABLET FOUR TIMES A DAY 360 tablet 3   Cholecalciferol (VITAMIN D3) 1000 units CAPS Take 2,000 Units by mouth.     losartan (COZAAR) 25 MG tablet Take 25 mg by mouth daily.     Lutein 20 MG TABS Take 1 tablet by mouth daily after supper.     Multiple Vitamin (MULTIVITAMIN) tablet Take by mouth.     Omega-3 Fatty Acids (FISH OIL) 1000 MG CAPS Take by mouth.     TURMERIC PO Take 500 mg by mouth.     vitamin B-12 (CYANOCOBALAMIN) 500 MCG tablet Take 500 mcg by mouth  daily.     Vitamin E 180 MG (400 UNIT) CAPS Take 180 mg by mouth.     pramipexole (MIRAPEX) 0.5 MG tablet Take 0.5 mg by mouth 4 (four) times daily.     No current facility-administered medications for this visit.    REVIEW OF SYSTEMS:   Constitutional: Denies fevers, chills or abnormal night sweats Eyes: Denies blurriness of vision, double vision or watery eyes Ears, nose, mouth, throat, and face: Denies mucositis or sore throat Respiratory: Denies cough, dyspnea or wheezes Cardiovascular: Denies palpitation, chest discomfort or lower extremity swelling Gastrointestinal:  Denies nausea, heartburn or change in bowel habits Skin: Denies abnormal skin rashes Lymphatics: Denies new lymphadenopathy or easy bruising Neurological:Denies numbness, tingling or new weaknesses Behavioral/Psych: Mood is stable, no new changes  Breast: Denies any palpable lumps or discharge All other systems were reviewed with the patient and are negative.  PHYSICAL EXAMINATION: ECOG PERFORMANCE STATUS: 0 - Asymptomatic  Vitals:   11/22/23 0839  BP: 139/70  Pulse: 83  Resp: 16  Temp: 97.7 F (36.5 C)  SpO2: 100%   Filed Weights   11/22/23 0839  Weight: 129 lb 14.4 oz (58.9 kg)    GENERAL:alert, no distress and comfortable SKIN: skin color, texture, turgor are normal, no rashes or significant lesions EYES: normal, conjunctiva are pink and non-injected, sclera clear OROPHARYNX:no exudate, no erythema and lips, buccal mucosa, and tongue normal  NECK: supple, thyroid normal size, non-tender, without nodularity LYMPH:  no palpable lymphadenopathy in the cervical, axillary  LUNGS: clear to auscultation and percussion with normal breathing effort HEART: regular rate & rhythm and no murmurs and no lower extremity edema ABDOMEN:abdomen soft, non-tender and normal bowel sounds Musculoskeletal:no cyanosis of digits and no clubbing  PSYCH: alert & oriented x 3 with fluent speech NEURO: no focal motor/sensory  deficits BREAST: No palpable nodules in breast. Post biopsy changes in Left breast inner quadrant.  LABORATORY DATA:  I have reviewed the data as listed No results found for: "WBC", "HGB", "HCT", "MCV", "PLT" No results found for: "NA", "K", "CL", "CO2"  RADIOGRAPHIC STUDIES: I have personally reviewed the radiological reports and agreed with the findings in the report.  ASSESSMENT AND PLAN:   Malignant neoplasm of upper-inner quadrant of left breast in  female, estrogen receptor positive (HCC) This is a very pleasant 68 year old postmenopausal female patient with newly diagnosed left breast cancer referred to breast [facility] for new diagnosis of left breast grade 2 IDC.  Small (5mm) estrogen and progesterone positive, HER2 negative tumor. Discussed the plan for lumpectomy, radiation, and antiestrogen therapy. Discussed the following details about oncotype.  We have discussed about Oncotype Dx score which is a well validated prognostic scoring system which can predict outcome with endocrine therapy alone and whether chemotherapy reduces recurrence.  Typically in patients with ER positive cancers that are node negative if the RS score is high typically greater than or equal to 26, chemotherapy is recommended.  -Plan for lumpectomy and radiation therapy. -Perform Oncotype test on tumor tissue post-lumpectomy. -Start antiestrogen therapy (Tamoxifen or Anastrozole/Letrozole) post-radiation.  Genetic Testing Discussed the plan for genetic testing. -Perform genetic testing.  Research Participation Patient expressed interest in participating in research studies. -Enroll patient in [employer] and blood collection studies.  All questions were answered. The patient knows to call the clinic with any problems, questions or concerns.    Rachel Moulds, MD 11/22/23

## 2023-11-22 NOTE — Therapy (Signed)
OUTPATIENT PHYSICAL THERAPY BREAST CANCER BASELINE EVALUATION   Patient Name: Breanna Powers MRN: [id number] DOB:November 03, 1955, 68 y.o., female Today's Date: 11/22/2023  END OF SESSION:  PT End of Session - 11/22/23 1055     Visit Number 1    Number of Visits 2    Date for PT Re-Evaluation 01/17/24    PT Start Time 0949    PT Stop Time 1008   Also sawpt from 1020-1030 for a total of 29 min   PT Time Calculation (min) 19 min    Activity Tolerance Patient tolerated treatment well    Behavior During Therapy WFL for tasks assessed/performed             Past Medical History:  Diagnosis Date   Dermatitis    Eczema    Fibroid    Hemiparesis (HCC)    right side "part of the parkinson's" also had a muscle tear repair in R shoulder   Hypertension    Knee pain    Menopause    Parkinson disease (HCC)    Past Surgical History:  Procedure Laterality Date   BREAST BIOPSY Left 11/14/2023   [place] LT BREAST BX W LOC DEV 1ST LESION IMG BX SPEC US GUIDE 11/14/2023 [facility]   COLONOSCOPY     2021   CYSTOSCOPY     10/2019   SHOULDER ARTHROSCOPY Right    2018 W biceps tenodesis   TOTAL ABDOMINAL HYSTERECTOMY     10-29-2019   TRIGGER FINGER RELEASE Left    09/29/2020   tummy tuck     04-2023   WISDOM TOOTH EXTRACTION     Patient Active Problem List   Diagnosis Date Noted   Malignant neoplasm of upper-inner quadrant of left breast in female, estrogen receptor positive (HCC) 11/20/2023   Hemiparesis (HCC) 06/30/2016   KNEE PAIN 09/16/2008   ITBS, RIGHT KNEE 09/16/2008   UNEQUAL LEG LENGTH 09/16/2008    REFERRING PROVIDER: Dr. Almond Lint  REFERRING DIAG: Left breast cancer  THERAPY DIAG:  Malignant neoplasm of upper-inner quadrant of left breast in female, estrogen receptor positive (HCC)  Abnormal posture  Stiffness of right shoulder, not elsewhere classified  Rationale for Evaluation and Treatment: Rehabilitation  ONSET DATE: 11/16/2023  SUBJECTIVE:                                                                                                                                                                                            SUBJECTIVE STATEMENT: Patient reports she is here today to be seen by her medical team for her newly diagnosed left breast cancer.   PERTINENT HISTORY:  Patient was  diagnosed on 11/16/2023 with left grade 2 invasive ductal carcinoma breast cancer. It measures 5 mm and is located in the upper inner quadrant. It is ER/PR positive and HER2 negative with a Ki67 of 10%. She has a right shoulder muscle repair in 2018 resulting in right shoulder tightness.   PATIENT GOALS:   reduce lymphedema risk and learn post op HEP.   PAIN:  Are you having pain? No  PRECAUTIONS: Active CA   RED FLAGS: None   HAND DOMINANCE: right  WEIGHT BEARING RESTRICTIONS: No  FALLS:  Has patient fallen in last 6 months? No  LIVING ENVIRONMENT: Patient lives with: her husband Lives in: House/apartment Has following equipment at home: None  OCCUPATION: Retired  LEISURE: She does yoga twice a week, lifts weights, and walks or hikes 1-2x/week  PRIOR LEVEL OF FUNCTION: Independent   OBJECTIVE: Note: Objective measures were completed at Evaluation unless otherwise noted.  COGNITION: Overall cognitive status: Within functional limits for tasks assessed    POSTURE:  Forward head and rounded shoulders posture  UPPER EXTREMITY AROM/PROM:  A/PROM RIGHT   eval   Shoulder extension 47  Shoulder flexion 153  Shoulder abduction 156  Shoulder internal rotation 78  Shoulder external rotation 62    (Blank rows = not tested)  A/PROM LEFT   eval  Shoulder extension 54  Shoulder flexion 146  Shoulder abduction 167  Shoulder internal rotation 73  Shoulder external rotation 80    (Blank rows = not tested)  CERVICAL AROM: All within normal limits  UPPER EXTREMITY STRENGTH: WFL  LYMPHEDEMA ASSESSMENTS (in cm):   LANDMARK RIGHT   eval   10 cm proximal to olecranon process 26  Olecranon process 22.3  10 cm proximal to ulnar styloid process 18.2  Just proximal to ulnar styloid process 13.8  Across hand at thumb web space 17.2  At base of 2nd digit 5.6  (Blank rows = not tested)  LANDMARK LEFT   eval  10 cm proximal to olecranon process 26.2  Olecranon process 22.6  10 cm proximal to ulnar styloid process 18.1  Just proximal to ulnar styloid process 13.8  Across hand at thumb web space 16.7  At base of 2nd digit 5.4  (Blank rows = not tested)  L-DEX LYMPHEDEMA SCREENING:  The patient was assessed using the L-Dex machine today to produce a lymphedema index baseline score. The patient will be reassessed on a regular basis (typically every 3 months) to obtain new L-Dex scores. If the score is > 6.5 points away from his/her baseline score indicating onset of subclinical lymphedema, it will be recommended to wear a compression garment for 4 weeks, 12 hours per day and then be reassessed. If the score continues to be > 6.5 points from baseline at reassessment, we will initiate lymphedema treatment. Assessing in this manner has a 95% rate of preventing clinically significant lymphedema.   L-DEX FLOWSHEETS - 11/22/23 1000       L-DEX LYMPHEDEMA SCREENING   Measurement Type Unilateral    L-DEX MEASUREMENT EXTREMITY Upper Extremity    POSITION  Standing    DOMINANT SIDE Right    At Risk Side Left    BASELINE SCORE (UNILATERAL) -10.6             QUICK DASH SURVEY:  [name] - 11/22/23 0001     Open a tight or new jar Moderate difficulty    Do heavy household chores (wash walls, wash floors) Mild difficulty    Carry a  shopping bag or briefcase No difficulty    Wash your back Moderate difficulty    Use a knife to cut food Moderate difficulty    Recreational activities in which you take some force or impact through your arm, shoulder, or hand (golf, hammering, tennis) Moderate difficulty    During the past week,  to what extent has your arm, shoulder or hand problem interfered with your normal social activities with family, friends, neighbors, or groups? Slightly    During the past week, to what extent has your arm, shoulder or hand problem limited your work or other regular daily activities Slightly    Arm, shoulder, or hand pain. Mild    Tingling (pins and needles) in your arm, shoulder, or hand Mild    Difficulty Sleeping Mild difficulty    DASH Score 31.82 %              PATIENT EDUCATION:  Education details: Lymphedema risk reduction and post op shoulder/posture HEP Person educated: Patient Education method: Explanation, Demonstration, Handout Education comprehension: Patient verbalized understanding and returned demonstration  HOME EXERCISE PROGRAM: Patient was instructed today in a home exercise program today for post op shoulder range of motion. These included active assist shoulder flexion in sitting, scapular retraction, wall walking with shoulder abduction, and hands behind head external rotation.  She was encouraged to do these twice a day, holding 3 seconds and repeating 5 times when permitted by her physician.   ASSESSMENT:  CLINICAL IMPRESSION: Patient was diagnosed on 11/16/2023 with left grade 2 invasive ductal carcinoma breast cancer. It measures 5 mm and is located in the upper inner quadrant. It is ER/PR positive and HER2 negative with a Ki67 of 10%. She has a right shoulder muscle repair in 2018 resulting in right shoulder tightness. Her multidisciplinary medical team met prior to her assessments to determine a recommended treatment plan. She is planning to have a left lumpectomy and sentinel node biopsy followed by Oncotype testing, radiation, and anti-estrogen therapy. She will benefit from a post op PT reassessment to determine needs and from L-Dex screens every 3 months for 2 years to detect subclinical lymphedema.  Pt will benefit from skilled therapeutic intervention to  improve on the following deficits: Decreased knowledge of precautions, impaired UE functional use, pain, decreased ROM, postural dysfunction.   PT treatment/interventions: ADL/self-care home management, pt/family education, therapeutic exercise  REHAB POTENTIAL: Excellent  CLINICAL DECISION MAKING: Stable/uncomplicated  EVALUATION COMPLEXITY: Low   GOALS: Goals reviewed with patient? YES  LONG TERM GOALS: (STG=LTG)    Name Target Date Goal status  1 Pt will be able to verbalize understanding of pertinent lymphedema risk reduction practices relevant to her dx specifically related to skin care.  Baseline:  No knowledge 11/22/2023 Achieved at eval  2 Pt will be able to return demo and/or verbalize understanding of the post op HEP related to regaining shoulder ROM. Baseline:  No knowledge 11/22/2023 Achieved at eval  3 Pt will be able to verbalize understanding of the importance of attending the post op After Breast CA Class for further lymphedema risk reduction education and therapeutic exercise.  Baseline:  No knowledge 11/22/2023 Achieved at eval  4 Pt will demo she has regained full shoulder ROM and function post operatively compared to baselines.  Baseline: See objective measurements taken today. 01/17/2024     PLAN:  PT FREQUENCY/DURATION: EVAL and 1 follow up appointment.   PLAN FOR NEXT SESSION: will reassess 3-4 weeks post op to determine needs.  Patient will follow up at outpatient cancer rehab 3-4 weeks following surgery.  If the patient requires physical therapy at that time, a specific plan will be dictated and sent to the referring physician for approval. The patient was educated today on appropriate basic range of motion exercises to begin post operatively and the importance of attending the After Breast Cancer class following surgery.  Patient was educated today on lymphedema risk reduction practices as it pertains to recommendations that will benefit the patient  immediately following surgery.  She verbalized good understanding.    Physical Therapy Information for After Breast Cancer Surgery/Treatment:  Lymphedema is a swelling condition that you may be at risk for in your arm if you have lymph nodes removed from the armpit area.  After a sentinel node biopsy, the risk is approximately 5-9% and is higher after an axillary node dissection.  There is treatment available for this condition and it is not life-threatening.  Contact your physician or physical therapist with concerns. You may begin the 4 shoulder/posture exercises (see additional sheet) when permitted by your physician (typically a week after surgery).  If you have drains, you may need to wait until those are removed before beginning range of motion exercises.  A general recommendation is to not lift your arms above shoulder height until drains are removed.  These exercises should be done to your tolerance and gently.  This is not a "no pain/no gain" type of recovery so listen to your body and stretch into the range of motion that you can tolerate, stopping if you have pain.  If you are having immediate reconstruction, ask your plastic surgeon about doing exercises as he or she may want you to wait. We encourage you to attend the free one time ABC (After Breast Cancer) class offered by [facility].  You will learn information related to lymphedema risk, prevention and treatment and additional exercises to regain mobility following surgery.  You can call [phone] for more information.  This is offered the 1st and 3rd Monday of each month.  You only attend the class one time. While undergoing any medical procedure or treatment, try to avoid blood pressure being taken or needle sticks from occurring on the arm on the side of cancer.   This recommendation begins after surgery and continues for the rest of your life.  This may help reduce your risk of getting lymphedema (swelling in  your arm). An excellent resource for those seeking information on lymphedema is the National Lymphedema Network's web site. It can be accessed at www.lymphnet.org If you notice swelling in your hand, arm or breast at any time following surgery (even if it is many years from now), please contact your doctor or physical therapist to discuss this.  Lymphedema can be treated at any time but it is easier for you if it is treated early on.  If you feel like your shoulder motion is not returning to normal in a reasonable amount of time, please contact your surgeon or physical therapist.  [facility] [phone]. [address], [place] [address]  ABC CLASS After Breast Cancer Class  After Breast Cancer Class is a specially designed exercise class to assist you in a safe recover after having breast cancer surgery.  In this class you will learn how to get back to full function whether your drains were just removed or if you had surgery a month ago.  This one-time class is  held the 1st and 3rd Monday of every month from 11:00 a.m. until 12:00 noon virtually.  This class is FREE and space is limited. For more information or to register for the next available class, call [phone].  Class Goals  Understand specific stretches to improve the flexibility of you chest and shoulder. Learn ways to safely strengthen your upper body and improve your posture. Understand the warning signs of infection and why you may be at risk for an arm infection. Learn about Lymphedema and prevention.  ** You do not attend this class until after surgery.  Drains must be removed to participate  Patient was instructed today in a home exercise program today for post op shoulder range of motion. These included active assist shoulder flexion in sitting, scapular retraction, wall walking with shoulder abduction, and hands behind head external rotation.  She was encouraged to do  these twice a day, holding 3 seconds and repeating 5 times when permitted by her physician.  Bethann Punches, [place] 11/22/23 11:04 AM

## 2023-11-22 NOTE — Progress Notes (Signed)
REFERRING PROVIDER: Rachel Moulds, MD [address] [place],  [place] [address]  PRIMARY PROVIDER:  Jarrett Soho, PA-C  PRIMARY REASON FOR VISIT:  1. Malignant neoplasm of upper-inner quadrant of left breast in female, estrogen receptor positive (HCC)   2. Family history of breast cancer     HISTORY OF PRESENT ILLNESS:   Breanna Powers, a 68 y.o. female, was seen for a [place] cancer genetics consultation at the request of Dr. Al Pimple due to a personal and family history of breast cancer.  Breanna Powers presents to clinic today to discuss the possibility of a hereditary predisposition to cancer, to discuss genetic testing, and to further clarify her future cancer risks, as well as potential cancer risks for family members.   In January 2025, at the age of 23, Breanna Powers was diagnosed with invasive ductal carcinoma of the left breast (ER+/PR+/HER2-). The treatment plan is pending.   CANCER HISTORY:  Oncology History  Malignant neoplasm of upper-inner quadrant of left breast in female, estrogen receptor positive (HCC)  10/09/2023 Mammogram   Mammogram showed possible asymmetry in the right breast and left breast hence diagnostic mammogram and ultrasound were recommended.  Diagnostic mammogram once again confirmed a 5 x 4 x 4 mm irregular hypoechoic mass left breast at 9 o'clock position 6 cm from the nipple.  No left axillary adenopathy.  Questioned asymmetry in the right breast resolved with additional imaging compatible with dense overlapping fibroglandular tissue.   11/14/2023 Pathology Results   Left breast needle core biopsy at 9:00 showed invasive ductal carcinoma, overall grade 2, prognostics showed ER 95% positive strong staining PR 60% positive weak to moderate staining, Ki-67 of 10% and HER2 1+.   11/20/2023 Initial Diagnosis   Malignant neoplasm of upper-inner quadrant of left breast in female, estrogen receptor positive (HCC)   11/22/2023 Cancer Staging   Staging form: Breast, AJCC  8th Edition - Pathologic stage from 11/22/2023: Stage IA (pT1a, pN0, cM0, G2, ER+, PR+, HER2-) - Signed by Breanna Moulds, MD on 11/22/2023 Stage prefix: Initial diagnosis Histologic grading system: 3 grade system Laterality: Left Stage used in treatment planning: Yes National guidelines used in treatment planning: Yes Type of national guideline used in treatment planning: NCCN      Past Medical History:  Diagnosis Date   Dermatitis    Eczema    Fibroid    Hemiparesis (HCC)    right side "part of the parkinson's" also had a muscle tear repair in R shoulder   Hypertension    Knee pain    Menopause    Parkinson disease (HCC)     Past Surgical History:  Procedure Laterality Date   BREAST BIOPSY Left 11/14/2023   [place] LT BREAST BX W LOC DEV 1ST LESION IMG BX SPEC US GUIDE 11/14/2023 [facility]   COLONOSCOPY     2021   CYSTOSCOPY     10/2019   SHOULDER ARTHROSCOPY Right    2018 W biceps tenodesis   TOTAL ABDOMINAL HYSTERECTOMY     10-29-2019   TRIGGER FINGER RELEASE Left    09/29/2020   tummy tuck     04-2023   WISDOM TOOTH EXTRACTION      FAMILY HISTORY:  We obtained a detailed, 4-generation family history.  Significant diagnoses are listed below: Family History  Problem Relation Age of Onset   Breast cancer Sister 91       d. 37   Cancer Maternal Aunt        unk type; ?  pancreatic; mets dx >50   Breast cancer Paternal Aunt        dx >50   Breast cancer Paternal Grandmother        mets; d. 68s    Breanna Powers thinks one of her sisters (no cancer history) recently had negative hereditary cancer genetic testing.  No report was available for review today.  She is unaware of other relatives who have had genetic testing.  Other relatives are unavailable for testing at this time.   There is no reported Ashkenazi Jewish ancestry. There is no known consanguinity.  GENETIC COUNSELING ASSESSMENT: Breanna Powers is a 68 y.o. female with a personal and family history which is  somewhat suggestive of a hereditary cancer syndrome and predisposition to cancer given the presence of breast cancer in multiple generations, with her sister being diagnosed before age 69. We, therefore, discussed and recommended the following at today's visit.   DISCUSSION: We discussed that 5 - 10% of cancer is hereditary.  Most cases of hereditary breast cancer are associated with mutations in BRCA1/2.  There are other genes that can be associated with hereditary breast cancer syndromes.  We discussed that testing is beneficial for several reasons including knowing how to follow individuals for their cancer risks, identifying whether potential treatment/surgery options would be beneficial, and understanding if other family members could be at risk for cancer and allowing them to undergo genetic testing.   We reviewed the characteristics, features and inheritance patterns of hereditary cancer syndromes. We also discussed genetic testing, including the appropriate family members to test, the process of testing, insurance coverage and turn-around-time for results. We discussed the implications of a negative, positive, carrier and/or variant of uncertain significant result. We recommended Breanna Powers pursue genetic testing for a panel that includes genes associated with breast cancer and other cancers.   The [facility] CancerNext+RNAinsight Panel includes sequencing, rearrangement analysis, and RNA analysis for the following 39 genes: APC, ATM, BAP1, BARD1, BMPR1A, BRCA1, BRCA2, BRIP1, CDH1, CDKN2A, CHEK2, FH, FLCN, MET, MLH1, MSH2, MSH6, MUTYH, NF1, NTHL1, PALB2, PMS2, PTEN, RAD51C, RAD51D, SMAD4, STK11, TP53, TSC1, TSC2, and VHL (sequencing and deletion/duplication); AXIN2, HOXB13, MBD4, MSH3, POLD1 and POLE (sequencing only); EPCAM and GREM1 (deletion/duplication only).  Based on Breanna Powers's personal history of breast cancer and her sister's history of breast cancer before age 65, she meets NCCN criteria for  genetic testing.She is the most informative relative in the family to have genetic testing at this time. Despite that she meets criteria, she may still have an out of pocket cost. We discussed that if her out of pocket cost for testing is over $100, the laboratory should contact her and discuss the self-pay prices and/or patient pay assistance programs.    PLAN: After considering the risks, benefits, and limitations, Breanna Powers provided informed consent to pursue genetic testing and the blood sample was sent to [facility] for analysis of the CancerNext+RNAinsight Panel. Results should be available within approximately 2 weeks, at which point they will be disclosed by telephone to Breanna Powers, as will any additional recommendations warranted by these results. Breanna Powers will receive a summary of her genetic counseling visit and a copy of her results once available. This information will also be available in Epic.   Breanna Powers questions were answered to her satisfaction today. Our contact information was provided should additional questions or concerns arise. Thank you for the referral and allowing [place] to share in the care of your patient.   Breanna Powers  Breanna Piccolo, MS, [facility] Genetic Counselor [email or web address] (P) [phone]   35 minutes were spent on the date of the encounter in service to the patient including preparation, face-to-face consultation, documentation and care coordination.  The patient was accompanied by her husband, Breanna Powers.  Dr. Al Pimple was available to discuss this case as needed.    _______________________________________________________________________ For Office Staff:  Number of people involved in session: 2 Was an Intern/ student involved with case: no

## 2023-11-23 ENCOUNTER — Other Ambulatory Visit: Payer: Self-pay | Admitting: General Surgery

## 2023-11-23 ENCOUNTER — Telehealth: Payer: Self-pay | Admitting: Hematology and Oncology

## 2023-11-23 DIAGNOSIS — C50212 Malignant neoplasm of upper-inner quadrant of left female breast: Secondary | ICD-10-CM

## 2023-11-23 NOTE — Telephone Encounter (Signed)
Spoke with patient confirming upcoming appointment

## 2023-11-26 ENCOUNTER — Encounter: Payer: Self-pay | Admitting: Neurology

## 2023-11-27 ENCOUNTER — Telehealth: Payer: Self-pay | Admitting: *Deleted

## 2023-11-27 NOTE — Telephone Encounter (Signed)
[employer] 2021-05 - Specimen Collection Study to Evaluate Biomarkers in Subjects with Cancer   Called patient to follow up on the above study and see if she has any questions.  Patient denied any questions and states she wants to participate. Patient agreed to come into clinic on 12/06/23 at 12 pm for research consent and lab draw. Patient states she has the number for this research nurse and will call if any questions before that appointment. Thanked patient for her time and willingness to support this study.  Domenica Reamer, BSN, RN, [employer] Research Nurse II [phone] 11/27/2023 4:40 PM

## 2023-11-27 NOTE — Telephone Encounter (Signed)
[id number]- Effectiveness of Out-of Pocket Cost Communication and Financial Navigation (CostCOM) in Cancer Patients:   Patient states interest in the above study. Study was introduced by Dr. Al Pimple to patient during her visit last week. Informed patient that research nurse will give patient a copy of the consent form and information about the study on 12/06/23 when she comes in for the [employer] study.  Informed patient that enrollment on this study could happen after she has surgery and we know what type of treatment she will be prescribed.  Patient may start chemotherapy depending on Oncotype score or anti estrogens alone after radiation.  Patient verbalized understanding.  Domenica Reamer, BSN, RN, [employer] Research Nurse II [phone] 11/27/2023 4:43 PM

## 2023-11-28 ENCOUNTER — Telehealth: Payer: Self-pay | Admitting: *Deleted

## 2023-11-28 ENCOUNTER — Encounter: Payer: Self-pay | Admitting: *Deleted

## 2023-11-28 NOTE — Telephone Encounter (Signed)
Spoke with patient to follow up from [facility] 1/29 and assess navigation needs. Patient denies any questions or concerns at this time. Enouraged her to call should anything arise.

## 2023-11-29 ENCOUNTER — Encounter (HOSPITAL_BASED_OUTPATIENT_CLINIC_OR_DEPARTMENT_OTHER): Payer: Self-pay | Admitting: General Surgery

## 2023-11-29 ENCOUNTER — Other Ambulatory Visit: Payer: Self-pay

## 2023-11-30 ENCOUNTER — Telehealth: Payer: Self-pay | Admitting: Genetic Counselor

## 2023-11-30 ENCOUNTER — Telehealth: Payer: Self-pay | Admitting: *Deleted

## 2023-11-30 ENCOUNTER — Encounter: Payer: Self-pay | Admitting: Genetic Counselor

## 2023-11-30 DIAGNOSIS — Z1379 Encounter for other screening for genetic and chromosomal anomalies: Secondary | ICD-10-CM | POA: Insufficient documentation

## 2023-11-30 NOTE — Telephone Encounter (Signed)
 Patient called wishing to reschedule appt for 2/12.  Sent message to research team requesting they give her a call.   Informed patient that BRCAPlus panel was negative.  Pan-cancer panel is pending

## 2023-11-30 NOTE — Telephone Encounter (Signed)
 [id number]- Patient requests to move her appts for Research Consent and Lab from Wednesday 12/06/23 to Tuesday morning 12/05/23 at 9 am.  Informed patient we will r/s the appointments and to please call if any questions. She verbalized understanding.  Cherylyn Hoard, BSN, RN, [employer] Research Nurse II [phone] 11/30/2023 3:34 PM

## 2023-12-01 ENCOUNTER — Ambulatory Visit: Payer: Self-pay | Admitting: Genetic Counselor

## 2023-12-01 ENCOUNTER — Encounter (HOSPITAL_BASED_OUTPATIENT_CLINIC_OR_DEPARTMENT_OTHER)
Admission: RE | Admit: 2023-12-01 | Discharge: 2023-12-01 | Disposition: A | Discharge status: Home / Self Care | Payer: Medicare Other | Source: Ambulatory Visit | Attending: General Surgery | Admitting: General Surgery

## 2023-12-01 DIAGNOSIS — Z01812 Encounter for preprocedural laboratory examination: Secondary | ICD-10-CM | POA: Diagnosis not present

## 2023-12-01 DIAGNOSIS — Z1379 Encounter for other screening for genetic and chromosomal anomalies: Secondary | ICD-10-CM

## 2023-12-01 DIAGNOSIS — Z170 Estrogen receptor positive status [ER+]: Secondary | ICD-10-CM

## 2023-12-01 DIAGNOSIS — Z803 Family history of malignant neoplasm of breast: Secondary | ICD-10-CM

## 2023-12-01 MED ORDER — CHLORHEXIDINE GLUCONATE CLOTH 2 % EX PADS: 6.0000 | MEDICATED_PAD | Freq: Once | CUTANEOUS | Status: DC

## 2023-12-01 NOTE — Progress Notes (Signed)
 HPI:   Breanna Powers was previously seen in the [facility] due to a personal and family history of breast cancer and concerns regarding a hereditary predisposition to cancer.    Breanna Powers recent genetic test results were disclosed to her by telephone. These results and recommendations are discussed in more detail below.  CANCER HISTORY:  Oncology History  Malignant neoplasm of upper-inner quadrant of left breast in female, estrogen receptor positive (HCC)  10/09/2023 Mammogram   Mammogram showed possible asymmetry in the right breast and left breast hence diagnostic mammogram and ultrasound were recommended.  Diagnostic mammogram once again confirmed a 5 x 4 x 4 mm irregular hypoechoic mass left breast at 9 o'clock position 6 cm from the nipple.  No left axillary adenopathy.  Questioned asymmetry in the right breast resolved with additional imaging compatible with dense overlapping fibroglandular tissue.   11/14/2023 Pathology Results   Left breast needle core biopsy at 9:00 showed invasive ductal carcinoma, overall grade 2, prognostics showed ER 95% positive strong staining PR 60% positive weak to moderate staining, Ki-67 of 10% and HER2 1+.   11/20/2023 Initial Diagnosis   Malignant neoplasm of upper-inner quadrant of left breast in female, estrogen receptor positive (HCC)   11/22/2023 Cancer Staging   Staging form: Breast, AJCC 8th Edition - Pathologic stage from 11/22/2023: Stage IA (pT1a, pN0, cM0, G2, ER+, PR+, HER2-) - Signed by Loretha Ash, MD on 11/22/2023 Stage prefix: Initial diagnosis Histologic grading system: 3 grade system Laterality: Left Stage used in treatment planning: Yes National guidelines used in treatment planning: Yes Type of national guideline used in treatment planning: NCCN   11/30/2023 Genetic Testing   Negative [facility] CancerNext+RNAinsight Panel.  Report date is 11/30/2023.  The [facility] CancerNext+RNAinsight Panel includes sequencing,  rearrangement analysis, and RNA analysis for the following 39 genes: APC, ATM, BAP1, BARD1, BMPR1A, BRCA1, BRCA2, BRIP1, CDH1, CDKN2A, CHEK2, FH, FLCN, MET, MLH1, MSH2, MSH6, MUTYH, NF1, NTHL1, PALB2, PMS2, PTEN, RAD51C, RAD51D, SMAD4, STK11, TP53, TSC1, TSC2, and VHL (sequencing and deletion/duplication); AXIN2, HOXB13, MBD4, MSH3, POLD1 and POLE (sequencing only); EPCAM and GREM1 (deletion/duplication only).      FAMILY HISTORY:  We obtained a detailed, 4-generation family history.  Significant diagnoses are listed below:      Family History  Problem Relation Age of Onset   Breast cancer Sister 26        d. 68   Cancer Maternal Aunt          unk type; ? pancreatic; mets dx >50   Breast cancer Paternal Aunt          dx >50   Breast cancer Paternal Grandmother          mets; d. 53s     Breanna Powers thinks one of her sisters (no cancer history) recently had negative hereditary cancer genetic testing.  No report was available for review today.  She is unaware of other relatives who have had genetic testing.  Other relatives are unavailable for testing at this time.    There is no reported Ashkenazi Jewish ancestry. There is no known consanguinity.    GENETIC TEST RESULTS:  The [facility] CancerNext+RNAinsight Panel found no pathogenic mutations.   The [facility] CancerNext+RNAinsight Panel includes sequencing, rearrangement analysis, and RNA analysis for the following 39 genes: APC, ATM, BAP1, BARD1, BMPR1A, BRCA1, BRCA2, BRIP1, CDH1, CDKN2A, CHEK2, FH, FLCN, MET, MLH1, MSH2, MSH6, MUTYH, NF1, NTHL1, PALB2, PMS2, PTEN, RAD51C, RAD51D, SMAD4, STK11, TP53, TSC1, TSC2, and  VHL (sequencing and deletion/duplication); AXIN2, HOXB13, MBD4, MSH3, POLD1 and POLE (sequencing only); EPCAM and GREM1 (deletion/duplication only).  The test report has been scanned into EPIC and is located under the Molecular Pathology section of the Results Review tab.  A portion of the result report is included below for reference.  Genetic testing reported out on November 30, 2023.     Even though a pathogenic variant was not identified, possible explanations for the cancer in the family may include: There may be no hereditary risk for cancer in the family. The cancers in Breanna Powers and/or her family may be sporadic/familial or due to other genetic and environmental factors.  Most cancer is not hereditary.  There may be a gene mutation in one of these genes that current testing methods cannot detect but that chance is small. There could be another gene that has not yet been discovered, or that we have not yet tested, that is responsible for the cancer diagnoses in the family.  It is also possible there is a hereditary cause for the cancer in the family that Breanna Powers did not inherit.   Therefore, it is important to remain in touch with cancer genetics in the future so that we can continue to offer Breanna Powers the most up to date genetic testing.    ADDITIONAL GENETIC TESTING:   Breanna Powers genetic testing was fairly extensive.  If there are additional relevant genes identified to increase cancer risk that can be analyzed in the future, we would be happy to discuss and coordinate this testing at that time.    CANCER SCREENING RECOMMENDATIONS:  Breanna Powers test result is considered negative (normal).  This means that we have not identified a hereditary cause for her personal history of breast cancer at this time.   An individual's cancer risk and medical management are not determined by genetic test results alone. Overall cancer risk assessment incorporates additional factors, including personal medical history, family history, and any available genetic information that may result in a personalized plan for cancer prevention and surveillance. Therefore, it is recommended she continue to follow the cancer management and screening guidelines provided by her oncology and primary healthcare provider.   RECOMMENDATIONS FOR FAMILY  MEMBERS:   Since she did not inherit a identifiable mutation in a cancer predisposition gene included on this panel, her children could not have inherited a known mutation from her in one of these genes. Individuals in this family might be at some increased risk of developing cancer, over the general population risk, due to the family history of cancer.  Individuals in the family should notify their providers of the family history of cancer. We recommend women in this family have a yearly mammogram beginning at age 11, or 37 years younger than the earliest onset of cancer, an annual clinical breast exam, and perform monthly breast self-exams.  Risk models that take into account family history and hormonal history may be helpful in determining appropriate breast cancer screening options for family members. Other members of the family may still carry a pathogenic variant in one of these genes that Breanna Powers did not inherit. Based on the family history, we recommend first degree relatives of her sister, who was diagnosed with breast cancer at age 14, have genetic counseling and testing. Ms. Pitney can let us  know if we can be of any assistance in coordinating genetic counseling and/or testing for these family member.     FOLLOW-UP:  Cancer genetics is a rapidly  advancing field and it is possible that new genetic tests will be appropriate for her and/or her family members in the future. We encourage Ms. Buonocore to remain in contact with cancer genetics, so we can update her personal and family histories and let her know of advances in cancer genetics that may benefit this family.   Our contact number was provided.  They are welcome to call us  at anytime with additional questions or concerns.   Alexsandria Kivett M. Nydia, MS, [facility] Genetic Counselor [email or web address] (P) [phone]

## 2023-12-01 NOTE — Progress Notes (Signed)

## 2023-12-05 ENCOUNTER — Other Ambulatory Visit: Payer: Self-pay | Admitting: *Deleted

## 2023-12-05 ENCOUNTER — Encounter: Payer: Self-pay | Admitting: *Deleted

## 2023-12-05 ENCOUNTER — Inpatient Hospital Stay: Payer: Medicare Other | Attending: Hematology and Oncology | Admitting: *Deleted

## 2023-12-05 ENCOUNTER — Inpatient Hospital Stay: Payer: Medicare Other

## 2023-12-05 DIAGNOSIS — C50212 Malignant neoplasm of upper-inner quadrant of left female breast: Secondary | ICD-10-CM

## 2023-12-05 LAB — RESEARCH LABS

## 2023-12-05 NOTE — H&P (Signed)
REFERRING PHYSICIAN: Annia Belt  PROVIDER: Matthias Hughs, MD  Care Team: Patient Care Team: Jarrett Soho, [place] as PCP - General (Family Medicine) Matthias Hughs, MD as Consulting Provider (Surgical Oncology) Iruku, Shawn Stall, MD (Hematology and Oncology) Buckner Malta, MD (Radiation Oncology)   MRN: [id number] DOB: 1955-11-24 DATE OF ENCOUNTER: 11/22/2023  Subjective   Chief Complaint: Breast Cancer   History of Present Illness: Breanna Powers is a 68 y.o. female who is seen today as an office consultation at the request of Dr. Al Pimple for evaluation of Breast Cancer .   Patient presents with a new diagnosis of left breast cancer January 2025. She had screening detected bilateral asymmetries. Right side resolved with compression. Left side showed a 5 mm mass at 9 o'clock. Core needle biopsy was performed. This showed grade 2 invasive ductal carcinoma, +/weak +/negative, Ki 67 10%. Axilla was negative.   Family cancer history - breast cancer in sister (mid 68s), paternal grandmother and paternal aunt (28s). Menarche - 13 Menopause - 49 (hysterectomy) Parity G0  Work - retired Advertising account executive. Now does gym several times per week. Has been doing puzzles. Enjoys hair metal bands :)  Diagnostic mammogram/us :[facility] 11/10/23  ACR Breast Density Category c: The breasts are heterogeneously dense, which may obscure small masses.  FINDINGS: Within the posteromedial left breast there is a small lobular spiculated mass. This is further evaluated with spot compression views.  Questioned asymmetry right breast resolved with additional imaging compatible with dense overlapping fibroglandular tissue.  Targeted ultrasound is performed, showing a 5 x 4 x 4 mm irregular hypoechoic mass left breast 9 o'clock position 6 cm from the nipple. No left axillary adenopathy.  IMPRESSION: Suspicious left breast mass 9 o'clock  position.  RECOMMENDATION: Ultrasound-guided core needle biopsy left breast mass 9 o'clock position.  I have discussed the findings and recommendations with the patient. If applicable, a reminder letter will be sent to the patient regarding the next appointment.  BI-RADS CATEGORY 5: Highly suggestive of malignancy.    Pathology core needle biopsy: 11/14/23 1. Breast, left, needle core biopsy, 9 o'clock, 6cmfn :  - INVASIVE DUCTAL CARCINOMA  Receptors: The tumor cells are negative for Her2 (1+).  Estrogen Receptor: 95%, POSITIVE, STRONG STAINING INTENSITY  Progesterone Receptor: 60%, POSITIVE, WEAK-MODERATE STAINING INTENSITY  Proliferation Marker Ki67: 10%   Review of Systems: A complete review of systems was obtained from the patient. I have reviewed this information and discussed as appropriate with the patient. See HPI as well for other ROS. ROS - positive for hearing loss/hearing aid, sleeps on multiple pillows.   Medical History: Past Medical History:  Diagnosis Date  Anxiety  History of cancer   Patient Active Problem List  Diagnosis  Malignant neoplasm of upper-inner quadrant of left breast in female, estrogen receptor positive (CMS/HHS-HCC)  Family history of cancer  Parkinson disease (CMS/HHS-HCC)   Past Surgical History:  Procedure Laterality Date  .Left Breast Biopsy Left 11/14/2023  HYSTERECTOMY N/A  2021  Tummy Tuck N/A  2024    No Known Allergies  Current Outpatient Medications on File Prior to Visit  Medication Sig Dispense Refill  amantadine HCL (SYMMETREL) 100 mg capsule Take 100 mg by mouth 2 (two) times daily  carbidopa-levodopa (SINEMET CR) 50-200 mg CR tablet Take 1 tablet by mouth at bedtime  carbidopa-levodopa (SINEMET) 25-100 mg tablet Take 1 tablet by mouth 4 (four) times daily  losartan (COZAAR) 25 MG tablet Take 25 mg by mouth once  daily  pramipexole (MIRAPEX) 0.5 MG tablet Take 0.5 mg by mouth 4 (four) times daily   No current  facility-administered medications on file prior to visit.   Family History  Problem Relation Age of Onset  High blood pressure (Hypertension) Mother  Coronary Artery Disease (Blocked arteries around heart) Father  Breast cancer Sister    Social History   Tobacco Use  Smoking Status Never  Smokeless Tobacco Never    Social History   Socioeconomic History  Marital status: Married  Tobacco Use  Smoking status: Never  Smokeless tobacco: Never  Vaping Use  Vaping status: Never Used  Substance and Sexual Activity  Alcohol use: Yes  Comment: 2 glasses a week  Drug use: Never   Social Drivers of Corporate investment banker Strain: Low Risk (03/08/2023)  Received from [facility]  Overall Financial Resource Strain (CARDIA)  Difficulty of Paying Living Expenses: Not hard at all  Food Insecurity: No Food Insecurity (03/08/2023)  [facility]  Hunger Vital Sign  Worried About Running Out of Food in the Last Year: Never true  Ran Out of Food in the Last Year: Never true  Transportation Needs: No Transportation Needs (03/08/2023)  Received from [facility] - Transportation  Lack of Transportation (Medical): No  Lack of Transportation (Non-Medical): No  Physical Activity: Insufficiently Active (08/10/2021)  [facility] from [facility] visits prior to 12/24/2022.  Exercise Vital Sign  Days of Exercise per Week: 1 day  Minutes of Exercise per Session: 60 min  Stress: Stress Concern Present (08/10/2021)  Received from [facility] visits prior to 12/24/2022.  [employer] of Occupational Health - Occupational Stress Questionnaire  Feeling of Stress : Rather much  Received from [employer]  Social Network  Housing Stability: Low Risk (03/08/2023)  Received from [facility] Stability Vital Sign  Unable to Pay for Housing in the Last Year: No  Number of Places Lived in the Last Year: 1  Unstable  Housing in the Last Year: No   Objective:   Vitals:  11/22/23 0954  BP: 139/70  Pulse: 83  Resp: 16  Temp: 36.5 C (97.7 F)  Weight: 58.9 kg (129 lb 14.4 oz)  Height: 157.5 cm (5\' 2" )   Body mass index is 23.76 kg/m.  Gen: No acute distress. Well nourished and well groomed.  Neurological: Alert and oriented to person, place, and time. Coordination normal.  Head: Normocephalic and atraumatic.  Eyes: Conjunctivae are normal. Pupils are equal, round, and reactive to light. No scleral icterus.  Neck: Normal range of motion. Neck supple. No tracheal deviation or thyromegaly present.  Cardiovascular: Normal rate, regular rhythm, normal heart sounds and intact distal pulses. Exam reveals no gallop and no friction rub. No murmur heard. Breast: relatively symmetric size. Old bruising left inner breast. Tender in that spot. Scattered dense tissue. No nipple retraction, no nipple discharge. No LAD.  Respiratory: Effort normal. No respiratory distress. No chest wall tenderness. Breath sounds normal. No wheezes, rales or rhonchi.  GI: Soft. Bowel sounds are normal. The abdomen is soft and nontender. There is no rebound and no guarding.  Musculoskeletal: Normal range of motion. Extremities are nontender.  Lymphadenopathy: No cervical, preauricular, postauricular or axillary adenopathy is present Skin: Skin is warm and dry. No rash noted. No diaphoresis. No erythema. No pallor. No clubbing, cyanosis, or edema.  Psychiatric: Normal mood and affect. Behavior is normal. Judgment and thought content normal.  Labs CBC, CMET normal 11/22/23  Assessment and Plan:   ICD-10-CM  1. Malignant neoplasm of upper-inner quadrant of left breast in female, estrogen receptor positive (CMS/HHS-HCC) C50.212  Z17.0   2. Family history of cancer Z80.9   3. Parkinson's disease with dyskinesia, unspecified whether manifestations fluctuate (CMS/HHS-HCC) G20.B1    Patient has a new diagnosis of cT1bN0 ER + left  breast cancer. This is amenable to breast conservation. Lumpectomy with sentinel node would be followed by +/- oncotype depending on final size, radiation, and antihormonal tx.   Genetic testing is being performed today.   Patient is agreeable to breast conservation.   The surgical procedure was described to the patient. I discussed the incision type and location and that we would need radiology involved pre op to place a seed 1-2 days pre op.   We discussed the risks bleeding, infection, chronic pain/numbness, damage to other structures, need for further procedures/surgeries. We discussed the risk of seroma. The patient was advised that we may need to go back to surgery for additional tissue to obtain negative margins. The patient was advised that these are the most common complications, but that others can occur as well. I discussed the risk of alteration in breast contour or size. I discussed risk of chronic pain. There are rare instances of heart/lung issues post op as well as blood clots.   They were advised against taking aspirin or other anti-inflammatory agents/blood thinners the week before surgery.   The risks and benefits of the procedure were described to the patient and she wishes to proceed.   No follow-ups on file.  Maudry Diego, MD FACS Surgical Oncology, General Surgery, Trauma and Critical Care [facility]

## 2023-12-05 NOTE — Research (Signed)
Effectiveness of Out-of-Pocket Psychologist, forensic ([employer]) in Cancer Patients   Patient Breanna Powers was identified by Dr. Ned Card as a potential candidate for the above listed study.  This Clinical Research Nurse met with Breanna Powers, [id number], on 12/05/23 in a manner and location that ensures patient privacy to discuss participation in the above listed research study.  Patient is Unaccompanied.  A copy of the informed consent document and separate HIPAA Authorization was provided to the patient.  Patient reads, speaks, and understands [place].   Patient was provided with the business card of  Nurse [facility] Finzel RN) and encouraged to contact the research team with any questions.  Approximately 10 minutes was spent with the patient reviewing the informed consent documents.  Patient was provided the option of taking informed consent documents home to review and was encouraged to review at their convenience with their support network, including other care providers. Patient took the consent documents home to review. The nurse informed the pt that [facility] RN would follow up with her after surgery to answer any questions about the study.  The pt verbalized understanding. Janan Ridge RN, BSN, CCRP Clinical Research Nurse Lead 12/05/2023 10:00 AM

## 2023-12-05 NOTE — Research (Signed)
[employer] 2021-05 - Specimen Collection Study to Evaluate Biomarkers in Subjects with Cancer   This Nurse has reviewed this patient's inclusion and exclusion criteria as a second review and confirms Breanna Powers is eligible for study participation.  Patient may continue with enrollment.  Zerita Boers BSN RN Clinical Research Nurse [facility] Direct Dial: [phone] 12/05/2023  9:09 AM

## 2023-12-05 NOTE — Research (Signed)
[employer] 2021-05 - Specimen Collection Study to Evaluate Biomarkers in Subjects with Cancer    Patient Breanna Powers was identified by Dr. Al Pimple as a potential candidate for the above listed study.  This Clinical Research Nurse met with Breanna Powers, [id number] on 12/05/23 in a manner and location that ensures patient privacy to discuss participation in the above listed research study.  Patient is Unaccompanied.  Patient was previously provided with informed consent documents.  Patient confirmed they have read the informed consent documents.  As outlined in the informed consent form, this Nurse and Myra Rude discussed the purpose of the research study, the investigational nature of the study, study procedures and requirements for study participation, potential risks and benefits of study participation, as well as alternatives to participation.  This study is not blinded or double-blinded. The patient understands participation is voluntary and they may withdraw from study participation at any time.  This study does not involve randomization.  This study does not involve an investigational drug or device. This study does not involve a placebo. Patient understands enrollment is pending full eligibility review.   Confidentiality and how the patient's information will be used as part of study participation were discussed.  Patient was informed there is reimbursement provided for their time and effort spent on trial participation.  The patient is encouraged to discuss research study participation with their insurance provider to determine what costs they may incur as part of study participation, including research related injury.    All questions were answered to patient's satisfaction.  The informed consent with embedded HIPAA language was reviewed page by page.  The patient's mental and emotional status is appropriate to provide informed consent, and the patient verbalizes an understanding  of study participation.  Patient has agreed to participate in the above listed research study and has voluntarily signed the informed consent version [employer] IRB approved version 14Jan2022 (revised 30Jan2023) with embedded HIPAA language, version 14Jan2022 (revised on 30Jan2023)  on 12/05/23 at 9:20 AM.  The patient was provided with a copy of the signed informed consent form with embedded HIPAA language for their reference.  No study specific procedures were obtained prior to the signing of the informed consent document.  Approximately 35 minutes was spent with the patient reviewing the informed consent documents.  Patient was not requested to complete a Release of Information form.   Eligibility: This Nurse has reviewed this patient's inclusion and exclusion criteria with the patient and confirmed CARLOYN LAHUE is eligible for study participation.  Zerita Boers, RN, completed the 2nd eligibility review for this patient and confirmed the pt met all criteria for enrollment.  In addition,eligibility confirmed by treating investigator, Dr. Al Pimple, who also agrees that patient should proceed with enrollment. Patient will continue with enrollment.  Medical History: The research nurse asked the pt the following questions about her current and past medical history.  The pt understands that this data is needed for study purposes.    Menopausal status: Breanna Powers is post menopausal.    Medical History:  High Blood Pressure  Yes Coronary Artery Disease No Lupus    No Rheumatoid Arthritis  No Diabetes   No      Lynch Syndrome  No  Is the patient currently taking a magnesium supplement?   No  Does the patient have a personal history of cancer (greater than 5 years ago)?  No  Does the patient have a family history of cancer in 1st or  2nd degree relatives? Yes If yes, Relationship(s) and Cancer type(s)?  1st degree - sister-breast cancer, 2nd degree aunt (paternal)-breast cancer, and grandmother  (paternal) breast cancer  Does the patient have history of alcohol consumption? Yes   If yes, current or former? current  Number of years? 50 Drinks per week? 3/4   Does the patient have history of cigarette, cigar, pipe, or chewing tobacco use?  No   Blood Collection: Research blood samples obtained by fresh venipuncture.  Pt tolerated well with no adverse events.  Blood to be shipped by research specialist, Adella Nissen, today.    Gift Card: $50 gift card given to the pt for her participation by Adella Nissen, research specialist.    The pt was thanked for her participation in this data and specimen study.  The research nurse will request her tumor tissue slides to submit to the study and enter her study data.  Janan Ridge RN, BSN, CCRP Clinical Research Nurse Lead 12/05/2023 10:44 AM

## 2023-12-06 ENCOUNTER — Other Ambulatory Visit: Payer: Self-pay | Admitting: General Surgery

## 2023-12-06 ENCOUNTER — Other Ambulatory Visit: Payer: Medicare Other

## 2023-12-06 ENCOUNTER — Encounter: Payer: Medicare Other | Admitting: *Deleted

## 2023-12-06 ENCOUNTER — Ambulatory Visit
Admission: RE | Admit: 2023-12-06 | Discharge: 2023-12-06 | Disposition: A | Discharge status: Home / Self Care | Payer: Medicare Other | Source: Ambulatory Visit | Attending: General Surgery | Admitting: General Surgery

## 2023-12-06 DIAGNOSIS — D0512 Intraductal carcinoma in situ of left breast: Secondary | ICD-10-CM | POA: Diagnosis not present

## 2023-12-06 DIAGNOSIS — C50212 Malignant neoplasm of upper-inner quadrant of left female breast: Secondary | ICD-10-CM

## 2023-12-06 HISTORY — PX: BREAST BIOPSY: SHX20

## 2023-12-07 ENCOUNTER — Ambulatory Visit (HOSPITAL_BASED_OUTPATIENT_CLINIC_OR_DEPARTMENT_OTHER): Payer: Medicare Other | Admitting: Anesthesiology

## 2023-12-07 ENCOUNTER — Ambulatory Visit (HOSPITAL_BASED_OUTPATIENT_CLINIC_OR_DEPARTMENT_OTHER)
Admission: RE | Admit: 2023-12-07 | Discharge: 2023-12-07 | Disposition: A | Discharge status: Home / Self Care | Payer: Medicare Other | Attending: General Surgery | Admitting: General Surgery

## 2023-12-07 ENCOUNTER — Ambulatory Visit
Admission: RE | Admit: 2023-12-07 | Discharge: 2023-12-07 | Disposition: A | Discharge status: Home / Self Care | Payer: Medicare Other | Source: Ambulatory Visit | Attending: General Surgery | Admitting: General Surgery

## 2023-12-07 ENCOUNTER — Other Ambulatory Visit: Payer: Self-pay

## 2023-12-07 ENCOUNTER — Encounter (HOSPITAL_BASED_OUTPATIENT_CLINIC_OR_DEPARTMENT_OTHER)
Admission: RE | Disposition: A | Discharge status: Home / Self Care | Payer: Self-pay | Source: Home / Self Care | Attending: General Surgery

## 2023-12-07 DIAGNOSIS — Z170 Estrogen receptor positive status [ER+]: Secondary | ICD-10-CM | POA: Insufficient documentation

## 2023-12-07 DIAGNOSIS — G8918 Other acute postprocedural pain: Secondary | ICD-10-CM | POA: Diagnosis not present

## 2023-12-07 DIAGNOSIS — D0512 Intraductal carcinoma in situ of left breast: Secondary | ICD-10-CM | POA: Diagnosis not present

## 2023-12-07 DIAGNOSIS — C50212 Malignant neoplasm of upper-inner quadrant of left female breast: Secondary | ICD-10-CM | POA: Diagnosis not present

## 2023-12-07 DIAGNOSIS — I10 Essential (primary) hypertension: Secondary | ICD-10-CM | POA: Insufficient documentation

## 2023-12-07 DIAGNOSIS — Z1732 Human epidermal growth factor receptor 2 negative status: Secondary | ICD-10-CM | POA: Insufficient documentation

## 2023-12-07 DIAGNOSIS — G20B1 Parkinson's disease with dyskinesia, without mention of fluctuations: Secondary | ICD-10-CM | POA: Diagnosis not present

## 2023-12-07 DIAGNOSIS — Z1721 Progesterone receptor positive status: Secondary | ICD-10-CM | POA: Diagnosis not present

## 2023-12-07 DIAGNOSIS — Z803 Family history of malignant neoplasm of breast: Secondary | ICD-10-CM | POA: Diagnosis not present

## 2023-12-07 DIAGNOSIS — C50912 Malignant neoplasm of unspecified site of left female breast: Secondary | ICD-10-CM | POA: Diagnosis not present

## 2023-12-07 HISTORY — PX: BREAST LUMPECTOMY WITH RADIOACTIVE SEED AND SENTINEL LYMPH NODE BIOPSY: SHX6550

## 2023-12-07 SURGERY — BREAST LUMPECTOMY WITH RADIOACTIVE SEED AND SENTINEL LYMPH NODE BIOPSY
Anesthesia: General | Site: Breast | Laterality: Left

## 2023-12-07 MED ORDER — EPHEDRINE SULFATE-NACL 50-0.9 MG/10ML-% IV SOSY
PREFILLED_SYRINGE | INTRAVENOUS | Status: DC | PRN
  Administered 2023-12-07: 10 mg via INTRAVENOUS
  Administered 2023-12-07 (×3): 5 mg via INTRAVENOUS

## 2023-12-07 MED ORDER — OXYCODONE HCL 5 MG PO TABS: 5.0000 mg | ORAL_TABLET | Freq: Once | ORAL | Status: DC | PRN

## 2023-12-07 MED ORDER — OXYCODONE HCL 5 MG/5ML PO SOLN: 5.0000 mg | Freq: Once | ORAL | Status: DC | PRN

## 2023-12-07 MED ORDER — EPHEDRINE 5 MG/ML INJ
INTRAVENOUS | Status: AC
  Filled 2023-12-07: qty 5

## 2023-12-07 MED ORDER — LIDOCAINE HCL (PF) 1 % IJ SOLN
INTRAMUSCULAR | Status: AC
  Filled 2023-12-07: qty 30

## 2023-12-07 MED ORDER — MEPERIDINE HCL 25 MG/ML IJ SOLN: 6.2500 mg | INTRAMUSCULAR | Status: DC | PRN

## 2023-12-07 MED ORDER — FENTANYL CITRATE (PF) 100 MCG/2ML IJ SOLN
INTRAMUSCULAR | Status: DC | PRN
  Administered 2023-12-07: 50 ug via INTRAVENOUS

## 2023-12-07 MED ORDER — LIDOCAINE HCL (PF) 1 % IJ SOLN
INTRAMUSCULAR | Status: DC | PRN
  Administered 2023-12-07: 14 mL

## 2023-12-07 MED ORDER — MIDAZOLAM HCL 2 MG/2ML IJ SOLN
2.0000 mg | Freq: Once | INTRAMUSCULAR | Status: AC
  Administered 2023-12-07: 2 mg via INTRAVENOUS

## 2023-12-07 MED ORDER — 0.9 % SODIUM CHLORIDE (POUR BTL) OPTIME
TOPICAL | Status: DC | PRN
  Administered 2023-12-07: 1000 mL

## 2023-12-07 MED ORDER — SODIUM CHLORIDE 0.9 % IV SOLN: 12.5000 mg | INTRAVENOUS | Status: DC | PRN

## 2023-12-07 MED ORDER — PROPOFOL 10 MG/ML IV BOLUS
INTRAVENOUS | Status: DC | PRN
  Administered 2023-12-07: 150 mg via INTRAVENOUS

## 2023-12-07 MED ORDER — MAGTRACE LYMPHATIC TRACER
INTRAMUSCULAR | Status: DC | PRN
  Administered 2023-12-07: 2 mL via INTRAMUSCULAR

## 2023-12-07 MED ORDER — FENTANYL CITRATE (PF) 100 MCG/2ML IJ SOLN
INTRAMUSCULAR | Status: AC
  Filled 2023-12-07: qty 2

## 2023-12-07 MED ORDER — OXYCODONE HCL 5 MG PO TABS
2.5000 mg | ORAL_TABLET | Freq: Four times a day (QID) | ORAL | 0 refills | Status: DC | PRN
Start: 2023-12-07 — End: 2024-01-16

## 2023-12-07 MED ORDER — CEFAZOLIN SODIUM-DEXTROSE 2-4 GM/100ML-% IV SOLN
INTRAVENOUS | Status: AC
  Filled 2023-12-07: qty 100

## 2023-12-07 MED ORDER — HYDROMORPHONE HCL 1 MG/ML IJ SOLN: 0.2500 mg | INTRAMUSCULAR | Status: DC | PRN

## 2023-12-07 MED ORDER — CEFAZOLIN SODIUM-DEXTROSE 2-4 GM/100ML-% IV SOLN
2.0000 g | INTRAVENOUS | Status: AC
  Administered 2023-12-07: 2 g via INTRAVENOUS

## 2023-12-07 MED ORDER — AMISULPRIDE (ANTIEMETIC) 5 MG/2ML IV SOLN: 10.0000 mg | Freq: Once | INTRAVENOUS | Status: DC | PRN

## 2023-12-07 MED ORDER — PHENYLEPHRINE 80 MCG/ML (10ML) SYRINGE FOR IV PUSH (FOR BLOOD PRESSURE SUPPORT)
PREFILLED_SYRINGE | INTRAVENOUS | Status: AC
  Filled 2023-12-07: qty 10

## 2023-12-07 MED ORDER — LIDOCAINE 2% (20 MG/ML) 5 ML SYRINGE
INTRAMUSCULAR | Status: DC | PRN
Start: 2023-12-07 — End: 2023-12-07
  Administered 2023-12-07: 20 mg via INTRAVENOUS

## 2023-12-07 MED ORDER — ONDANSETRON HCL 4 MG/2ML IJ SOLN
INTRAMUSCULAR | Status: AC
  Filled 2023-12-07: qty 2

## 2023-12-07 MED ORDER — FENTANYL CITRATE (PF) 100 MCG/2ML IJ SOLN
100.0000 ug | Freq: Once | INTRAMUSCULAR | Status: AC
  Administered 2023-12-07: 100 ug via INTRAVENOUS

## 2023-12-07 MED ORDER — PHENYLEPHRINE 80 MCG/ML (10ML) SYRINGE FOR IV PUSH (FOR BLOOD PRESSURE SUPPORT)
PREFILLED_SYRINGE | INTRAVENOUS | Status: AC
  Filled 2023-12-07: qty 20

## 2023-12-07 MED ORDER — BUPIVACAINE-EPINEPHRINE (PF) 0.25% -1:200000 IJ SOLN
INTRAMUSCULAR | Status: AC
  Filled 2023-12-07: qty 30

## 2023-12-07 MED ORDER — DEXAMETHASONE SODIUM PHOSPHATE 10 MG/ML IJ SOLN
INTRAMUSCULAR | Status: AC
  Filled 2023-12-07: qty 1

## 2023-12-07 MED ORDER — PHENYLEPHRINE 80 MCG/ML (10ML) SYRINGE FOR IV PUSH (FOR BLOOD PRESSURE SUPPORT)
PREFILLED_SYRINGE | INTRAVENOUS | Status: DC | PRN
  Administered 2023-12-07: 120 ug via INTRAVENOUS
  Administered 2023-12-07 (×2): 160 ug via INTRAVENOUS
  Administered 2023-12-07: 80 ug via INTRAVENOUS
  Administered 2023-12-07 (×3): 120 ug via INTRAVENOUS

## 2023-12-07 MED ORDER — BUPIVACAINE HCL (PF) 0.5 % IJ SOLN
INTRAMUSCULAR | Status: DC | PRN
  Administered 2023-12-07: 30 mL via PERINEURAL

## 2023-12-07 MED ORDER — PROPOFOL 10 MG/ML IV BOLUS
INTRAVENOUS | Status: AC
  Filled 2023-12-07: qty 20

## 2023-12-07 MED ORDER — ACETAMINOPHEN 500 MG PO TABS
1000.0000 mg | ORAL_TABLET | ORAL | Status: AC
  Administered 2023-12-07: 1000 mg via ORAL

## 2023-12-07 MED ORDER — ACETAMINOPHEN 500 MG PO TABS
ORAL_TABLET | ORAL | Status: AC
  Filled 2023-12-07: qty 2

## 2023-12-07 MED ORDER — FENTANYL CITRATE (PF) 100 MCG/2ML IJ SOLN
INTRAMUSCULAR | Status: AC
Start: 2023-12-07 — End: ?
  Filled 2023-12-07: qty 2

## 2023-12-07 MED ORDER — DEXAMETHASONE SODIUM PHOSPHATE 10 MG/ML IJ SOLN
INTRAMUSCULAR | Status: DC | PRN
  Administered 2023-12-07: 10 mg via INTRAVENOUS

## 2023-12-07 MED ORDER — PROPOFOL 500 MG/50ML IV EMUL
INTRAVENOUS | Status: AC
  Filled 2023-12-07: qty 50

## 2023-12-07 MED ORDER — ARTIFICIAL TEARS OPHTHALMIC OINT
TOPICAL_OINTMENT | OPHTHALMIC | Status: AC
  Filled 2023-12-07: qty 3.5

## 2023-12-07 MED ORDER — SODIUM CHLORIDE (PF) 0.9 % IJ SOLN
INTRAMUSCULAR | Status: AC
  Filled 2023-12-07: qty 10

## 2023-12-07 MED ORDER — GLYCOPYRROLATE PF 0.2 MG/ML IJ SOSY
PREFILLED_SYRINGE | INTRAMUSCULAR | Status: AC
  Filled 2023-12-07: qty 1

## 2023-12-07 MED ORDER — MIDAZOLAM HCL 2 MG/2ML IJ SOLN
INTRAMUSCULAR | Status: AC
  Filled 2023-12-07: qty 2

## 2023-12-07 MED ORDER — LACTATED RINGERS IV SOLN: INTRAVENOUS | Status: DC

## 2023-12-07 MED ORDER — ONDANSETRON HCL 4 MG/2ML IJ SOLN
INTRAMUSCULAR | Status: DC | PRN
Start: 2023-12-07 — End: 2023-12-07
  Administered 2023-12-07: 4 mg via INTRAVENOUS

## 2023-12-07 MED ORDER — LIDOCAINE 2% (20 MG/ML) 5 ML SYRINGE
INTRAMUSCULAR | Status: AC
  Filled 2023-12-07: qty 15

## 2023-12-07 SURGICAL SUPPLY — 55 items
BINDER BREAST LRG (GAUZE/BANDAGES/DRESSINGS) IMPLANT
BINDER BREAST MEDIUM (GAUZE/BANDAGES/DRESSINGS) IMPLANT
BINDER BREAST XLRG (GAUZE/BANDAGES/DRESSINGS) IMPLANT
BINDER BREAST XXLRG (GAUZE/BANDAGES/DRESSINGS) IMPLANT
BLADE SURG 10 STRL SS (BLADE) ×1 IMPLANT
BLADE SURG 15 STRL LF DISP TIS (BLADE) ×1 IMPLANT
BNDG COHESIVE 4X5 TAN STRL LF (GAUZE/BANDAGES/DRESSINGS) ×1 IMPLANT
CANISTER SUC SOCK COL 7IN (MISCELLANEOUS) IMPLANT
CANISTER SUCT 1200ML W/VALVE (MISCELLANEOUS) ×1 IMPLANT
CHLORAPREP W/TINT 26 (MISCELLANEOUS) ×1 IMPLANT
CLIP TI LARGE 6 (CLIP) ×1 IMPLANT
CLIP TI MEDIUM 6 (CLIP) ×2 IMPLANT
CLIP TI WIDE RED SMALL 6 (CLIP) IMPLANT
COVER MAYO STAND STRL (DRAPES) ×2 IMPLANT
COVER PROBE CYLINDRICAL 5X96 (MISCELLANEOUS) ×1 IMPLANT
DERMABOND ADVANCED .7 DNX12 (GAUZE/BANDAGES/DRESSINGS) ×1 IMPLANT
DRAPE UTILITY XL STRL (DRAPES) ×1 IMPLANT
ELECT COATED BLADE 2.86 ST (ELECTRODE) ×1 IMPLANT
ELECT REM PT RETURN 9FT ADLT (ELECTROSURGICAL) ×1
ELECTRODE REM PT RTRN 9FT ADLT (ELECTROSURGICAL) ×1 IMPLANT
GAUZE PAD ABD 8X10 STRL (GAUZE/BANDAGES/DRESSINGS) ×1 IMPLANT
GAUZE SPONGE 4X4 12PLY STRL LF (GAUZE/BANDAGES/DRESSINGS) ×1 IMPLANT
GLOVE BIO SURGEON STRL SZ 6 (GLOVE) ×1 IMPLANT
GLOVE BIOGEL PI IND STRL 6.5 (GLOVE) ×1 IMPLANT
GOWN STRL REUS W/ TWL LRG LVL3 (GOWN DISPOSABLE) ×1 IMPLANT
GOWN STRL REUS W/ TWL XL LVL3 (GOWN DISPOSABLE) ×1 IMPLANT
KIT MARKER MARGIN INK (KITS) ×1 IMPLANT
LIGHT WAVEGUIDE WIDE FLAT (MISCELLANEOUS) IMPLANT
NDL HYPO 25X1 1.5 SAFETY (NEEDLE) ×1 IMPLANT
NDL SAFETY ECLIPSE 18X1.5 (NEEDLE) ×1 IMPLANT
NEEDLE HYPO 25X1 1.5 SAFETY (NEEDLE) ×1
NS IRRIG 1000ML POUR BTL (IV SOLUTION) ×1 IMPLANT
PACK BASIN DAY SURGERY FS (CUSTOM PROCEDURE TRAY) ×1 IMPLANT
PACK UNIVERSAL I (CUSTOM PROCEDURE TRAY) ×1 IMPLANT
PENCIL SMOKE EVACUATOR (MISCELLANEOUS) ×1 IMPLANT
SLEEVE SCD COMPRESS KNEE MED (STOCKING) ×1 IMPLANT
SPIKE FLUID TRANSFER (MISCELLANEOUS) IMPLANT
SPONGE T-LAP 18X18 ~~LOC~~+RFID (SPONGE) ×2 IMPLANT
STAPLER SKIN PROX WIDE 3.9 (STAPLE) IMPLANT
STOCKINETTE IMPERVIOUS LG (DRAPES) ×1 IMPLANT
STRIP CLOSURE SKIN 1/2X4 (GAUZE/BANDAGES/DRESSINGS) ×1 IMPLANT
SUT ETHILON 2 0 FS 18 (SUTURE) IMPLANT
SUT MNCRL AB 4-0 PS2 18 (SUTURE) ×1 IMPLANT
SUT MON AB 5-0 PS2 18 (SUTURE) IMPLANT
SUT SILK 2 0 SH (SUTURE) IMPLANT
SUT VIC AB 2-0 SH 27XBRD (SUTURE) ×1 IMPLANT
SUT VIC AB 3-0 SH 27X BRD (SUTURE) ×1 IMPLANT
SUT VICRYL 3-0 CR8 SH (SUTURE) ×1 IMPLANT
SYR BULB EAR ULCER 3OZ GRN STR (SYRINGE) ×1 IMPLANT
SYR CONTROL 10ML LL (SYRINGE) ×1 IMPLANT
TOWEL GREEN STERILE FF (TOWEL DISPOSABLE) ×1 IMPLANT
TRACER MAGTRACE VIAL (MISCELLANEOUS) IMPLANT
TRAY FAXITRON CT DISP (TRAY / TRAY PROCEDURE) ×1 IMPLANT
TUBE CONNECTING 20X1/4 (TUBING) ×1 IMPLANT
YANKAUER SUCT BULB TIP NO VENT (SUCTIONS) ×1 IMPLANT

## 2023-12-07 NOTE — Transfer of Care (Signed)
Immediate Anesthesia Transfer of Care Note  Patient: Breanna Powers  Procedure(s) Performed: LEFT SEED LUMPECTOMY AND SENTINEL LYMPH NODE BIOPSY (Left: Breast)  Patient Location: PACU  Anesthesia Type:General  Level of Consciousness: drowsy  Airway & Oxygen Therapy: Patient Spontanous Breathing and Patient connected to face mask oxygen  Post-op Assessment: Report given to RN and Post -op Vital signs reviewed and stable  Post vital signs: Reviewed and stable  Last Vitals:  Vitals Value Taken Time  BP 115/65 12/07/23 1545  Temp    Pulse 78 12/07/23 1547  Resp 18 12/07/23 1547  SpO2 100 % 12/07/23 1547  Vitals shown include unfiled device data.  Last Pain:  Vitals:   12/07/23 1240  TempSrc: Temporal  PainSc: 0-No pain         Complications: No notable events documented.

## 2023-12-07 NOTE — Discharge Instructions (Addendum)
[facility] Office Phone Number [phone]  BREAST BIOPSY/ PARTIAL MASTECTOMY: POST OP INSTRUCTIONS  Always review your discharge instruction sheet given to you by the facility where your surgery was performed.  IF YOU HAVE DISABILITY OR FAMILY LEAVE FORMS, YOU MUST BRING THEM TO THE OFFICE FOR PROCESSING.  DO NOT GIVE THEM TO YOUR DOCTOR.  Take 2 tylenol (acetominophen) three times a day for 3 days.  If you still have pain, add ibuprofen with food in between if able to take this (if you have kidney issues or stomach issues, do not take ibuprofen).  If both of those are not enough, add the narcotic pain pill.  If you find you are needing a lot of this overnight after surgery, call the next morning for a refill.    Prescriptions will not be filled after 5pm or on week-ends. Take your usually prescribed medications unless otherwise directed You should eat very light the first 24 hours after surgery, such as soup, crackers, pudding, etc.  Resume your normal diet the day after surgery. Most patients will experience some swelling and bruising in the breast.  Ice packs and a good support bra will help.  Swelling and bruising can take several days to resolve.  It is common to experience some constipation if taking pain medication after surgery.  Increasing fluid intake and taking a stool softener will usually help or prevent this problem from occurring.  A mild laxative (Milk of Magnesia or Miralax) should be taken according to package directions if there are no bowel movements after 48 hours. Unless discharge instructions indicate otherwise, you may remove your bandages 48 hours after surgery, and you may shower at that time.  You may have steri-strips (small skin tapes) in place directly over the incision.  These strips should be left on the skin at least for for 7-10 days.    ACTIVITIES:  You may resume regular daily activities (gradually increasing) beginning the next day.  Wearing a  good support bra or sports bra (or the breast binder) minimizes pain and swelling.  You may have sexual intercourse when it is comfortable. No heavy lifting for 1-2 weeks (not over around 10 pounds).  You may drive when you no longer are taking prescription pain medication, you can comfortably wear a seatbelt, and you can safely maneuver your car and apply brakes. RETURN TO WORK:  __________3-14 days depending on job. _______________ Bonita Quin should see your doctor in the office for a follow-up appointment approximately two weeks after your surgery.  Your doctor's nurse will typically make your follow-up appointment when she calls you with your pathology report.  Expect your pathology report 3-4 business days after your surgery.  You may call to check if you do not hear from [place] after three days.   WHEN TO CALL YOUR DOCTOR: Fever over 101.0 Nausea and/or vomiting. Extreme swelling or bruising. Continued bleeding from incision. Increased pain, redness, or drainage from the incision.  The clinic staff is available to answer your questions during regular business hours.  Please don't hesitate to call and ask to speak to one of the nurses for clinical concerns.  If you have a medical emergency, go to the nearest emergency room or call 911.  A surgeon from [facility] is always on call at the hospital.  For further questions, please visit [email or web address]     Post Anesthesia Home Care Instructions  Activity: Get plenty of rest for the remainder of the day. A responsible individual must  stay with you for 24 hours following the procedure.  For the next 24 hours, DO NOT: -Drive a car -Advertising copywriter -Drink alcoholic beverages -Take any medication unless instructed by your physician -Make any legal decisions or sign important papers.  Meals: Start with liquid foods such as gelatin or soup. Progress to regular foods as tolerated. Avoid greasy, spicy, heavy foods. If nausea  and/or vomiting occur, drink only clear liquids until the nausea and/or vomiting subsides. Call your physician if vomiting continues.  Special Instructions/Symptoms: Your throat may feel dry or sore from the anesthesia or the breathing tube placed in your throat during surgery. If this causes discomfort, gargle with warm salt water. The discomfort should disappear within 24 hours.  If you had a scopolamine patch placed behind your ear for the management of post- operative nausea and/or vomiting:  1. The medication in the patch is effective for 72 hours, after which it should be removed.  Wrap patch in a tissue and discard in the trash. Wash hands thoroughly with soap and water. 2. You may remove the patch earlier than 72 hours if you experience unpleasant side effects which may include dry mouth, dizziness or visual disturbances. 3. Avoid touching the patch. Wash your hands with soap and water after contact with the patch.     Regional Anesthesia Blocks  1. You may not be able to move or feel the "blocked" extremity after a regional anesthetic block. This may last may last from 3-48 hours after placement, but it will go away. The length of time depends on the medication injected and your individual response to the medication. As the nerves start to wake up, you may experience tingling as the movement and feeling returns to your extremity. If the numbness and inability to move your extremity has not gone away after 48 hours, please call your surgeon.   2. The extremity that is blocked will need to be protected until the numbness is gone and the strength has returned. Because you cannot feel it, you will need to take extra care to avoid injury. Because it may be weak, you may have difficulty moving it or using it. You may not know what position it is in without looking at it while the block is in effect.  3. For blocks in the legs and feet, returning to weight bearing and walking needs to be done  carefully. You will need to wait until the numbness is entirely gone and the strength has returned. You should be able to move your leg and foot normally before you try and bear weight or walk. You will need someone to be with you when you first try to ensure you do not fall and possibly risk injury.  4. Bruising and tenderness at the needle site are common side effects and will resolve in a few days.  5. Persistent numbness or new problems with movement should be communicated to the surgeon or the [facility] [phone] [facility] [phone]).  Last received tylenol at 1245pm

## 2023-12-07 NOTE — Progress Notes (Signed)
Assisted Dr. Hyacinth Meeker with left, pectoralis, ultrasound guided block. Side rails up, monitors on throughout procedure. See vital signs in flow sheet. Tolerated Procedure well.

## 2023-12-07 NOTE — Anesthesia Procedure Notes (Signed)
Anesthesia Regional Block: Pectoralis block   Pre-Anesthetic Checklist: , timeout performed,  Correct Patient, Correct Site, Correct Laterality,  Correct Procedure, Correct Position, site marked,  Risks and benefits discussed,  Surgical consent,  Pre-op evaluation,  At surgeon's request and post-op pain management  Laterality: Left  Prep: chloraprep       Needles:  Injection technique: Single-shot  Needle Type: Stimiplex     Needle Length: 9cm  Needle Gauge: 21     Additional Needles:   Procedures:,,,, ultrasound used (permanent image in chart),,    Narrative:  Start time: 12/07/2023 1:01 PM End time: 12/07/2023 1:06 PM Injection made incrementally with aspirations every 5 mL.  Performed by: Personally  Anesthesiologist: Lowella Curb, MD

## 2023-12-07 NOTE — Anesthesia Preprocedure Evaluation (Signed)
Anesthesia Evaluation  Patient identified by MRN, date of birth, ID band Patient awake    Reviewed: Allergy & Precautions, H&P , NPO status , Patient's Chart, lab work & pertinent test results  Airway Mallampati: II  TM Distance: >3 FB Neck ROM: Full    Dental no notable dental hx.    Pulmonary neg pulmonary ROS   Pulmonary exam normal breath sounds clear to auscultation       Cardiovascular hypertension, Pt. on medications negative cardio ROS Normal cardiovascular exam Rhythm:Regular Rate:Normal     Neuro/Psych negative neurological ROS  negative psych ROS   GI/Hepatic negative GI ROS, Neg liver ROS,,,  Endo/Other  negative endocrine ROS    Renal/GU negative Renal ROS  negative genitourinary   Musculoskeletal negative musculoskeletal ROS (+)    Abdominal   Peds negative pediatric ROS (+)  Hematology negative hematology ROS (+)   Anesthesia Other Findings Breast Cancer  Reproductive/Obstetrics negative OB ROS                             Anesthesia Physical Anesthesia Plan  ASA: 3  Anesthesia Plan: General   Post-op Pain Management: Regional block* and Tylenol PO (pre-op)*   Induction: Intravenous  PONV Risk Score and Plan: 3 and Ondansetron, Dexamethasone, Midazolam and Treatment may vary due to age or medical condition  Airway Management Planned: LMA  Additional Equipment:   Intra-op Plan:   Post-operative Plan: Extubation in OR  Informed Consent: I have reviewed the patients History and Physical, chart, labs and discussed the procedure including the risks, benefits and alternatives for the proposed anesthesia with the patient or authorized representative who has indicated his/her understanding and acceptance.     Dental advisory given  Plan Discussed with: CRNA  Anesthesia Plan Comments:        Anesthesia Quick Evaluation

## 2023-12-07 NOTE — Anesthesia Postprocedure Evaluation (Signed)
Anesthesia Post Note  Patient: Breanna Powers  Procedure(s) Performed: LEFT SEED LUMPECTOMY AND SENTINEL LYMPH NODE BIOPSY (Left: Breast)     Patient location during evaluation: PACU Anesthesia Type: General Level of consciousness: awake and alert Pain management: pain level controlled Vital Signs Assessment: post-procedure vital signs reviewed and stable Respiratory status: spontaneous breathing, nonlabored ventilation and respiratory function stable Cardiovascular status: blood pressure returned to baseline and stable Postop Assessment: no apparent nausea or vomiting Anesthetic complications: no   No notable events documented.  Last Vitals:  Vitals:   12/07/23 1600 12/07/23 1622  BP: 114/67 96/79  Pulse: 77 81  Resp: 17 18  Temp:  (!) 36.3 C  SpO2: 97% 96%    Last Pain:  Vitals:   12/07/23 1615  TempSrc:   PainSc: 1                  Lowella Curb

## 2023-12-07 NOTE — Op Note (Signed)
Left Breast Radioactive seed localized lumpectomy and sentinel lymph node biopsy  Indications: This patient presents with history of left breast cancer, cT1a-bN0 grade 2 invasive ductal carcinoma, upper inner quadrant, ER+/PR weakly + /Her2-  Pre-operative Diagnosis: left breast cancer  Post-operative Diagnosis: Same  Surgeon: Almond Lint   Assistant: n/a  Anesthesia: General endotracheal anesthesia  ASA Class: 3  Procedure Details  The patient was seen in the Holding Room. The risks, benefits, complications, treatment options, and expected outcomes were discussed with the patient. The possibilities of bleeding, infection, the need for additional procedures, failure to diagnose a condition, and creating a complication requiring transfusion or operation were discussed with the patient. The patient concurred with the proposed plan, giving informed consent.  The site of surgery properly noted/marked. The patient was taken to Operating Room # 8, identified, and the procedure verified as left Breast Seed localized Lumpectomy with sentinel lymph node biopsy. The left arm, breast, and chest were prepped and draped in standard fashion. A Time Out was held and the above information confirmed. The MagTrace was injected into the subareolar position.  The lumpectomy was performed by creating a medial transverse incision near the previously placed radioactive seed.  Dissection was carried down to around the point of maximum signal intensity. The cautery was used to perform the dissection.  Hemostasis was achieved with cautery.The specimen was inked with the margin marker paint kit.    Specimen radiography confirmed inclusion of the mammographic lesion, the clip, and the seed. The background signal in the breast was zero.   The edges of the cavity were marked with large clips.   The wound was irrigated and reinspected for hemostasis.   The breast tissue was rearranged and mastopexy sutures were placed to  minimize the defect. The skin was then closed with 3-0 vicryl in layers and 4-0 monocryl subcuticular suture.    Using a Sentimag probe, left axillary sentinel nodes were identified transcutaneously.  An oblique incision was created below the axillary hairline.  Dissection was carried through the clavipectoral fascia.  Two deep level two axillary sentinel nodes were removed.  Counts per second were 100, 280, and 50.    The background count was negative cps.  The wound was irrigated.  Hemostasis was achieved with cautery.  The axillary incision was closed with a 3-0 vicryl deep dermal interrupted sutures and a 4-0 monocryl subcuticular closure.    Sterile dressings were applied. At the end of the operation, all sponge, instrument, and needle counts were correct.  Findings: grossly clear surgical margins and no palpable adenopathy, anterior margin is skin, posterior margin is pectoralis.   Estimated Blood Loss:  min         Specimens: left breast tissue with seed, two left axillary sentinel lymph nodes.             Complications:  None; patient tolerated the procedure well.         Disposition: PACU - hemodynamically stable.         Condition: stable

## 2023-12-07 NOTE — Interval H&P Note (Signed)
History and Physical Interval Note:  12/07/2023 1:53 PM  Breanna Powers  has presented today for surgery, with the diagnosis of LEFT BREAST CANCER.  The various methods of treatment have been discussed with the patient and family. After consideration of risks, benefits and other options for treatment, the patient has consented to  Procedure(s) with comments: LEFT SEED LUMPECTOMY AND SENTINEL LYMPH NODE BIOPSY (Left) - GEN w/REGIONAL as a surgical intervention.  The patient's history has been reviewed, patient examined, no change in status, stable for surgery.  I have reviewed the patient's chart and labs.  Questions were answered to the patient's satisfaction.     Almond Lint

## 2023-12-07 NOTE — Anesthesia Procedure Notes (Signed)
Procedure Name: LMA Insertion Date/Time: 12/07/2023 2:16 PM  Performed by: Roosvelt Harps, CRNAPre-anesthesia Checklist: Patient identified, Emergency Drugs available, Suction available and Patient being monitored Patient Re-evaluated:Patient Re-evaluated prior to induction Oxygen Delivery Method: Circle System Utilized Preoxygenation: Pre-oxygenation with 100% oxygen Induction Type: IV induction Ventilation: Mask ventilation without difficulty LMA: LMA inserted LMA Size: 3.0 Number of attempts: 1 Airway Equipment and Method: Bite block Placement Confirmation: positive ETCO2 and breath sounds checked- equal and bilateral Tube secured with: Tape Dental Injury: Teeth and Oropharynx as per pre-operative assessment

## 2023-12-08 ENCOUNTER — Encounter (HOSPITAL_BASED_OUTPATIENT_CLINIC_OR_DEPARTMENT_OTHER): Payer: Self-pay | Admitting: General Surgery

## 2023-12-08 DIAGNOSIS — C50912 Malignant neoplasm of unspecified site of left female breast: Secondary | ICD-10-CM | POA: Diagnosis not present

## 2023-12-11 LAB — SURGICAL PATHOLOGY

## 2023-12-12 ENCOUNTER — Telehealth: Payer: Self-pay | Admitting: *Deleted

## 2023-12-12 ENCOUNTER — Encounter: Payer: Self-pay | Admitting: *Deleted

## 2023-12-12 NOTE — Telephone Encounter (Signed)
Received order for Oncotype Testing per Dr. Al Pimple. Requisition faxed to pathology

## 2023-12-21 ENCOUNTER — Encounter (HOSPITAL_COMMUNITY): Payer: Self-pay

## 2023-12-21 DIAGNOSIS — C50212 Malignant neoplasm of upper-inner quadrant of left female breast: Secondary | ICD-10-CM | POA: Diagnosis not present

## 2023-12-21 DIAGNOSIS — Z170 Estrogen receptor positive status [ER+]: Secondary | ICD-10-CM | POA: Diagnosis not present

## 2023-12-26 ENCOUNTER — Telehealth: Payer: Self-pay | Admitting: *Deleted

## 2023-12-26 ENCOUNTER — Encounter: Payer: Self-pay | Admitting: *Deleted

## 2023-12-26 NOTE — Telephone Encounter (Signed)
Received oncotype score of 27. Physician team notified.

## 2023-12-26 NOTE — Telephone Encounter (Signed)
 Scheduled appointments per referral. Patient is aware of the appointment time and date as well as the address. Patient was informed to arrive 10-15 minutes prior with updated insurance information. All questions were answered.

## 2023-12-27 ENCOUNTER — Other Ambulatory Visit: Payer: Self-pay | Admitting: General Surgery

## 2023-12-28 ENCOUNTER — Encounter: Payer: Self-pay | Admitting: Hematology and Oncology

## 2023-12-28 ENCOUNTER — Inpatient Hospital Stay: Attending: Hematology and Oncology | Admitting: Hematology and Oncology

## 2023-12-28 VITALS — BP 130/59 | HR 87 | Temp 98.0°F | Resp 17 | Wt 128.6 lb

## 2023-12-28 DIAGNOSIS — Z1721 Progesterone receptor positive status: Secondary | ICD-10-CM | POA: Diagnosis not present

## 2023-12-28 DIAGNOSIS — C50212 Malignant neoplasm of upper-inner quadrant of left female breast: Secondary | ICD-10-CM | POA: Diagnosis not present

## 2023-12-28 DIAGNOSIS — Z1732 Human epidermal growth factor receptor 2 negative status: Secondary | ICD-10-CM | POA: Insufficient documentation

## 2023-12-28 DIAGNOSIS — G20A1 Parkinson's disease without dyskinesia, without mention of fluctuations: Secondary | ICD-10-CM | POA: Diagnosis not present

## 2023-12-28 DIAGNOSIS — Z5189 Encounter for other specified aftercare: Secondary | ICD-10-CM | POA: Insufficient documentation

## 2023-12-28 DIAGNOSIS — Z5111 Encounter for antineoplastic chemotherapy: Secondary | ICD-10-CM | POA: Diagnosis not present

## 2023-12-28 DIAGNOSIS — Z79899 Other long term (current) drug therapy: Secondary | ICD-10-CM | POA: Diagnosis not present

## 2023-12-28 DIAGNOSIS — Z170 Estrogen receptor positive status [ER+]: Secondary | ICD-10-CM | POA: Diagnosis not present

## 2023-12-28 NOTE — Progress Notes (Signed)
 [facility] CONSULT NOTE  Patient Care Team: Jarrett Soho, PA-C as PCP - General (Family Medicine) Pershing Proud, RN as Oncology Nurse Navigator Donnelly Angelica, RN as Oncology Nurse Navigator Rachel Moulds, MD as Consulting Physician (Hematology and Oncology)  CHIEF COMPLAINTS/PURPOSE OF CONSULTATION:  Newly diagnosed breast cancer  HISTORY OF PRESENTING ILLNESS:  Breanna Powers 68 y.o. female is here because of recent diagnosis of left breast cancer.  I reviewed her records extensively and collaborated the history with the patient.  SUMMARY OF ONCOLOGIC HISTORY: Oncology History  Malignant neoplasm of upper-inner quadrant of left breast in female, estrogen receptor positive (HCC)  10/09/2023 Mammogram   Mammogram showed possible asymmetry in the right breast and left breast hence diagnostic mammogram and ultrasound were recommended.  Diagnostic mammogram once again confirmed a 5 x 4 x 4 mm irregular hypoechoic mass left breast at 9 o'clock position 6 cm from the nipple.  No left axillary adenopathy.  Questioned asymmetry in the right breast resolved with additional imaging compatible with dense overlapping fibroglandular tissue.   11/14/2023 Pathology Results   Left breast needle core biopsy at 9:00 showed invasive ductal carcinoma, overall grade 2, prognostics showed ER 95% positive strong staining PR 60% positive weak to moderate staining, Ki-67 of 10% and HER2 1+.   11/20/2023 Initial Diagnosis   Malignant neoplasm of upper-inner quadrant of left breast in female, estrogen receptor positive (HCC)   11/22/2023 Cancer Staging   Staging form: Breast, AJCC 8th Edition - Pathologic stage from 11/22/2023: Stage IA (pT1a, pN0, cM0, G2, ER+, PR+, HER2-) - Signed by Rachel Moulds, MD on 11/22/2023 Stage prefix: Initial diagnosis Histologic grading system: 3 grade system Laterality: Left Stage used in treatment planning: Yes National guidelines used in  treatment planning: Yes Type of national guideline used in treatment planning: NCCN   11/30/2023 Genetic Testing   Negative [facility] CancerNext+RNAinsight Panel.  Report date is 11/30/2023.  The [facility] CancerNext+RNAinsight Panel includes sequencing, rearrangement analysis, and RNA analysis for the following 39 genes: APC, ATM, BAP1, BARD1, BMPR1A, BRCA1, BRCA2, BRIP1, CDH1, CDKN2A, CHEK2, FH, FLCN, MET, MLH1, MSH2, MSH6, MUTYH, NF1, NTHL1, PALB2, PMS2, PTEN, RAD51C, RAD51D, SMAD4, STK11, TP53, TSC1, TSC2, and VHL (sequencing and deletion/duplication); AXIN2, HOXB13, MBD4, MSH3, POLD1 and POLE (sequencing only); EPCAM and GREM1 (deletion/duplication only).     Patient arrived to the appointment today with her husband. Discussed the use of AI scribe software for clinical note transcription with the patient, who gave verbal consent to proceed.  History of Present Illness    ZOANNE Powers is a 68 year old female with breast cancer who presents for discussion of Oncotype test results and treatment options. She is accompanied by her husband.  She underwent surgery for a small breast tumor, with clear lymph nodes but a positive posterior margin indicating invasive cells microscopically. She has not yet consulted with the breast surgeon post-surgery to determine if further surgical intervention is necessary.  She is healing well from the surgery with no significant pain, though there is some discomfort at the scar site. No other symptoms have been experienced post-surgery.  Her past medical history includes Parkinson's disease, managed with medication. She experiences internal tremors and twitching, particularly noticeable at rest or during sleep.  Rest of the pertinent 10 point ROS reviewed and neg.  MEDICAL HISTORY:  Past Medical History:  Diagnosis Date   Dermatitis    Eczema    Fibroid    Hemiparesis (HCC)    right  side "part of the parkinson's" also had a muscle tear repair in R shoulder    Hypertension    Knee pain    Menopause    Parkinson disease (HCC)     SURGICAL HISTORY: Past Surgical History:  Procedure Laterality Date   BREAST BIOPSY Left 11/14/2023   [place] LT BREAST BX W LOC DEV 1ST LESION IMG BX SPEC US GUIDE 11/14/2023 [facility]   BREAST BIOPSY Left 12/06/2023   [place] LT RADIOACTIVE SEED LOC 12/06/2023 [facility]AMMO[facility]   BREAST LUMPECTOMY WITH RADIOACTIVE SEED AND SENTINEL LYMPH NODE BIOPSY Left 12/07/2023   Procedure: LEFT SEED LUMPECTOMY AND SENTINEL LYMPH NODE BIOPSY;  Surgeon: Almond Lint, MD;  Location: [facility];  Service: General;  Laterality: Left;  GEN w/REGIONAL   COLONOSCOPY     2021   CYSTOSCOPY     10/2019   SHOULDER ARTHROSCOPY Right    2018 W biceps tenodesis   TOTAL ABDOMINAL HYSTERECTOMY     10-29-2019   TRIGGER FINGER RELEASE Left    09/29/2020   tummy tuck     04-2023   WISDOM TOOTH EXTRACTION      SOCIAL HISTORY: Social History   Socioeconomic History   Marital status: Married    Spouse name: Not on file   Number of children: 0   Years of education: Assoc   Highest education level: Not on file  Occupational History   Occupation: [employer]  Tobacco Use   Smoking status: Never   Smokeless tobacco: Never  Vaping Use   Vaping status: Never Used  Substance and Sexual Activity   Alcohol use: Yes   Drug use: Never   Sexual activity: Not on file    Comment: TAH  Other Topics Concern   Not on file  Social History Narrative   Patient drinks 3-4 cups of coffee a day    Associates degree   Right handed   Goes to the gym for exercise 3-4 times per week   Social Drivers of Health   Financial Resource Strain: Low Risk  (03/08/2023)   Received from [facility], [facility]   Overall Financial Resource Strain (CARDIA)    Difficulty of Paying Living Expenses: Not hard at all  Food Insecurity: No Food Insecurity (11/22/2023)   Hunger Vital Sign    Worried About Running Out of Food in the Last Year: Never  true    Ran Out of Food in the Last Year: Never true  Transportation Needs: No Transportation Needs (11/22/2023)   PRAPARE - Administrator, Civil Service (Medical): No    Lack of Transportation (Non-Medical): No  Physical Activity: Insufficiently Active (08/10/2021)   Received from [facility] visits prior to 12/24/2022., [facility] visits prior to 12/24/2022.   Exercise Vital Sign    Days of Exercise per Week: 1 day    Minutes of Exercise per Session: 60 min  Stress: Stress Concern Present (08/10/2021)   Received from [facility] visits prior to 12/24/2022., [facility] visits prior to 12/24/2022.   [employer] of Occupational Health - Occupational Stress Questionnaire    Feeling of Stress : Rather much  Social Connections: Unknown (02/07/2023)   Received from [facility], [facility]   Social Network    Social Network: Not on file  Intimate Partner Violence: Not At Risk (11/22/2023)   Humiliation, Afraid, Rape, and Kick questionnaire    Fear of Current  or Ex-Partner: No    Emotionally Abused: No    Physically Abused: No    Sexually Abused: No    FAMILY HISTORY: Family History  Problem Relation Age of Onset   Neuropathy Mother    Dementia Mother    Depression Mother    Hypertension Mother    Restless legs syndrome Mother    Parkinson's disease Father    Heart disease Father    Breast cancer Sister 72       d. 55   Atrial fibrillation Sister    Osteoporosis Sister    Heart disease Sister    Cancer Maternal Aunt        unk type; ? pancreatic; mets dx >50   Breast cancer Paternal Aunt        dx >50   Breast cancer Paternal Grandmother        mets; d. 75s   Heart disease Paternal Grandfather     ALLERGIES:  has no known allergies.  MEDICATIONS:  Current Outpatient Medications  Medication Sig Dispense Refill   amantadine (SYMMETREL) 100 MG capsule Take 1 capsule (100  mg total) by mouth 2 (two) times daily. 60 capsule 3   B Complex Vitamins (VITAMIN B COMPLEX) CAPS Take 1 capsule by mouth daily at 6 (six) AM.     Calcium Carb-Cholecalciferol (CALCIUM 600/VITAMIN D3) 600-20 MG-MCG TABS Take by mouth 2 (two) times daily.     carbidopa-levodopa (SINEMET CR) 50-200 MG tablet Take 1 tablet by mouth at bedtime. 30 tablet 5   carbidopa-levodopa (SINEMET IR) 25-100 MG tablet TAKE 1 TABLET FOUR TIMES A DAY 360 tablet 3   Cholecalciferol (VITAMIN D3) 1000 units CAPS Take 2,000 Units by mouth.     losartan (COZAAR) 25 MG tablet Take 25 mg by mouth daily.     Lutein 20 MG TABS Take 1 tablet by mouth daily after supper.     Multiple Vitamin (MULTIVITAMIN) tablet Take by mouth.     oxyCODONE (OXY IR/ROXICODONE) 5 MG immediate release tablet Take 0.5-1 tablets (2.5-5 mg total) by mouth every 6 (six) hours as needed for severe pain (pain score 7-10). 8 tablet 0   pramipexole (MIRAPEX) 0.5 MG tablet Take 0.5 mg by mouth 4 (four) times daily.     vitamin B-12 (CYANOCOBALAMIN) 500 MCG tablet Take 500 mcg by mouth daily.     Vitamin E 180 MG (400 UNIT) CAPS Take 180 mg by mouth.     No current facility-administered medications for this visit.    REVIEW OF SYSTEMS:   Constitutional: Denies fevers, chills or abnormal night sweats Eyes: Denies blurriness of vision, double vision or watery eyes Ears, nose, mouth, throat, and face: Denies mucositis or sore throat Respiratory: Denies cough, dyspnea or wheezes Cardiovascular: Denies palpitation, chest discomfort or lower extremity swelling Gastrointestinal:  Denies nausea, heartburn or change in bowel habits Skin: Denies abnormal skin rashes Lymphatics: Denies new lymphadenopathy or easy bruising Neurological:Denies numbness, tingling or new weaknesses Behavioral/Psych: Mood is stable, no new changes  Breast: Denies any palpable lumps or discharge All other systems were reviewed with the patient and are negative.  PHYSICAL  EXAMINATION: ECOG PERFORMANCE STATUS: 0 - Asymptomatic  Vitals:   12/28/23 1155  BP: (!) 130/59  Pulse: 87  Resp: 17  Temp: 98 F (36.7 C)  SpO2: 98%   Filed Weights   12/28/23 1155  Weight: 128 lb 9.6 oz (58.3 kg)    GENERAL:alert, no distress and comfortable  LABORATORY DATA:  I have  reviewed the data as listed Lab Results  Component Value Date   WBC 6.4 11/22/2023   HGB 13.2 11/22/2023   HCT 39.1 11/22/2023   MCV 95.6 11/22/2023   PLT 247 11/22/2023   Lab Results  Component Value Date   NA 139 11/22/2023   K 4.5 11/22/2023   CL 106 11/22/2023   CO2 29 11/22/2023    RADIOGRAPHIC STUDIES: I have personally reviewed the radiological reports and agreed with the findings in the report.  ASSESSMENT AND PLAN:   Malignant neoplasm of upper-inner quadrant of left breast in female, estrogen receptor positive (HCC) Breast Cancer Small tumor with positive posterior margin following surgery.  Oncotype score of 27, indicating a potential benefit from chemotherapy. Discussed the risks and benefits of chemotherapy, including potential side effects and impact on quality of life. -Consult with breast surgeon regarding need for further surgical intervention due to positive margin. -Discuss and decide on the initiation of chemotherapy (TC regimen, 4 cycles every 21 days). -Plan for radiation and antiestrogen therapy post-chemotherapy.  Parkinson's Disease Well-controlled with medication, but with noted internal tremors and arm twitching during sleep. -Continue current management and monitor symptoms. -Scheduled follow-up with neurologist in May.  Follow-up Plans -Discuss and decide on chemotherapy after consultation with breast surgeon. -Check-in after breast surgeon appointment next week.  Time spent: 30 min All questions were answered. The patient knows to call the clinic with any problems, questions or concerns.    Rachel Moulds, MD 12/28/23

## 2023-12-28 NOTE — Assessment & Plan Note (Signed)
 Breast Cancer Small tumor with positive posterior margin following surgery.  Oncotype score of 27, indicating a potential benefit from chemotherapy. Discussed the risks and benefits of chemotherapy, including potential side effects and impact on quality of life. -Consult with breast surgeon regarding need for further surgical intervention due to positive margin. -Discuss and decide on the initiation of chemotherapy (TC regimen, 4 cycles every 21 days). -Plan for radiation and antiestrogen therapy post-chemotherapy.  Parkinson's Disease Well-controlled with medication, but with noted internal tremors and arm twitching during sleep. -Continue current management and monitor symptoms. -Scheduled follow-up with neurologist in May.  Follow-up Plans -Discuss and decide on chemotherapy after consultation with breast surgeon. -Check-in after breast surgeon appointment next week.

## 2023-12-29 ENCOUNTER — Telehealth: Payer: Self-pay | Admitting: Hematology and Oncology

## 2023-12-29 NOTE — Telephone Encounter (Signed)
 Spoke with patient confirming upcoming appointment

## 2024-01-01 ENCOUNTER — Ambulatory Visit: Attending: General Surgery | Admitting: Physical Therapy

## 2024-01-01 ENCOUNTER — Other Ambulatory Visit: Payer: Self-pay | Admitting: *Deleted

## 2024-01-01 ENCOUNTER — Encounter: Payer: Self-pay | Admitting: Physical Therapy

## 2024-01-01 DIAGNOSIS — M25611 Stiffness of right shoulder, not elsewhere classified: Secondary | ICD-10-CM | POA: Diagnosis not present

## 2024-01-01 DIAGNOSIS — R293 Abnormal posture: Secondary | ICD-10-CM | POA: Diagnosis not present

## 2024-01-01 DIAGNOSIS — N898 Other specified noninflammatory disorders of vagina: Secondary | ICD-10-CM

## 2024-01-01 DIAGNOSIS — C50212 Malignant neoplasm of upper-inner quadrant of left female breast: Secondary | ICD-10-CM | POA: Diagnosis not present

## 2024-01-01 DIAGNOSIS — Z170 Estrogen receptor positive status [ER+]: Secondary | ICD-10-CM | POA: Diagnosis not present

## 2024-01-01 DIAGNOSIS — Z483 Aftercare following surgery for neoplasm: Secondary | ICD-10-CM | POA: Diagnosis not present

## 2024-01-01 NOTE — Patient Instructions (Signed)
 [facility]  [address], [address]  [place] [address]  [phone]  After Breast Cancer Class Video It is recommended you view the ABC class video to be educated on lymphedema risk reduction. This video lasts for about 30 minutes. It can be viewed on our website here: [email or web address]  Scar massage You can begin gentle scar massage to you incision sites. Gently place one hand on the incision and move the skin (without sliding on the skin) in various directions. Do this for a few minutes and then you can gently massage either coconut oil or vitamin E cream into the scars.  Compression garment You should continue wearing your compression bra until you feel like you no longer have swelling.  Home exercise Program Continue doing the exercises you were given until you feel like you can do them without feeling any tightness at the end.   Walking Program Studies show that 30 minutes of walking per day (fast enough to elevate your heart rate) can significantly reduce the risk of a cancer recurrence. If you can't walk due to other medical reasons, we encourage you to find another activity you could do (like a stationary bike or water exercise).  Posture After breast cancer surgery, people frequently sit with rounded shoulders posture because it puts their incisions on slack and feels better. If you sit like this and scar tissue forms in that position, you can become very tight and have pain sitting or standing with good posture. Try to be aware of your posture and sit and stand up tall to heal properly.  Follow up PT: It is recommended you return every 3 months for the first 3 years following surgery to be assessed on the SOZO machine for an L-Dex score. This helps prevent clinically significant lymphedema in 95% of patients. These follow up screens are 10 minute appointments that you are not billed  for.

## 2024-01-01 NOTE — Therapy (Signed)
 OUTPATIENT PHYSICAL THERAPY BREAST CANCER POST OP FOLLOW UP   Patient Name: Breanna Powers MRN: [id number] DOB:1955-10-30, 68 y.o., female Today's Date: 01/01/2024  END OF SESSION:  PT End of Session - 01/01/24 1107     Visit Number 2    Number of Visits 2    PT Start Time 1102    PT Stop Time 1153    PT Time Calculation (min) 51 min    Activity Tolerance Patient tolerated treatment well    Behavior During Therapy WFL for tasks assessed/performed             Past Medical History:  Diagnosis Date   Dermatitis    Eczema    Fibroid    Hemiparesis (HCC)    right side "part of the parkinson's" also had a muscle tear repair in R shoulder   Hypertension    Knee pain    Menopause    Parkinson disease (HCC)    Past Surgical History:  Procedure Laterality Date   BREAST BIOPSY Left 11/14/2023   [place] LT BREAST BX W LOC DEV 1ST LESION IMG BX SPEC US GUIDE 11/14/2023 [facility]   BREAST BIOPSY Left 12/06/2023   [place] LT RADIOACTIVE SEED LOC 12/06/2023 [facility]   BREAST LUMPECTOMY WITH RADIOACTIVE SEED AND SENTINEL LYMPH NODE BIOPSY Left 12/07/2023   Procedure: LEFT SEED LUMPECTOMY AND SENTINEL LYMPH NODE BIOPSY;  Surgeon: Almond Lint, MD;  Location: [facility];  Service: General;  Laterality: Left;  GEN w/REGIONAL   COLONOSCOPY     2021   CYSTOSCOPY     10/2019   SHOULDER ARTHROSCOPY Right    2018 W biceps tenodesis   TOTAL ABDOMINAL HYSTERECTOMY     10-29-2019   TRIGGER FINGER RELEASE Left    09/29/2020   tummy tuck     04-2023   WISDOM TOOTH EXTRACTION     Patient Active Problem List   Diagnosis Date Noted   Genetic testing 11/30/2023   Malignant neoplasm of upper-inner quadrant of left breast in female, estrogen receptor positive (HCC) 11/20/2023   Hemiparesis (HCC) 06/30/2016   KNEE PAIN 09/16/2008   ITBS, RIGHT KNEE 09/16/2008   UNEQUAL LEG LENGTH 09/16/2008    REFERRING PROVIDER: Dr. Almond Lint  REFERRING DIAG: Left breast  cancer  THERAPY DIAG:  Malignant neoplasm of upper-inner quadrant of left breast in female, estrogen receptor positive (HCC)  Abnormal posture  Stiffness of right shoulder, not elsewhere classified  Aftercare following surgery for neoplasm  Rationale for Evaluation and Treatment: Rehabilitation  ONSET DATE: 12/07/2023  SUBJECTIVE:  SUBJECTIVE STATEMENT: Patient reports she underwent a left lumpectomy and sentinel node biopsy on 12/07/2023 (2 negative nodes). There was a positive margin at the posterior aspect and she is scheduled to meet with her surgeon 01/03/2024 to discuss. Her Oncotype score was 27 so chemotherapy is recommended but she is unsure if she will agree to that. Radiation and anti-estrogen is also recommended. She also reports some concerns about vaginal dryness and is concerned that may worsen with chemo and anti-estrogen therapy.  PERTINENT HISTORY:  Patient was diagnosed on 11/16/2023 with left grade 2 invasive ductal carcinoma breast cancer. She underwent a left lumpectomy and sentinel node biopsy on 12/07/2023 (2 negative nodes). It is ER/PR positive and HER2 negative with a Ki67 of 10%. She has a right shoulder muscle repair in 2018 resulting in right shoulder tightness. She also has well-managed Parkinson's.  PATIENT GOALS:  Reassess how my recovery is going related to arm function, pain, and swelling.  PAIN:  Are you having pain? Yes: NPRS scale: 1/10 Pain location: left axilla Pain description: tight and achy Aggravating factors: nothing Relieving factors: nothing  PRECAUTIONS: Recent Surgery, left UE Lymphedema risk  RED FLAGS: None   ACTIVITY LEVEL / LEISURE: She started back to yoga last week and is back to lifting light weight  She is walking 2-5 miles every other  day  OBJECTIVE:   PATIENT SURVEYS:  QUICK DASH:  Quick Dash - 01/01/24 0001     Open a tight or new jar Moderate difficulty    Do heavy household chores (wash walls, wash floors) Mild difficulty    Carry a shopping bag or briefcase Mild difficulty    Wash your back Mild difficulty    Use a knife to cut food Moderate difficulty    Recreational activities in which you take some force or impact through your arm, shoulder, or hand (golf, hammering, tennis) Moderate difficulty    During the past week, to what extent has your arm, shoulder or hand problem interfered with your normal social activities with family, friends, neighbors, or groups? Modererately    During the past week, to what extent has your arm, shoulder or hand problem limited your work or other regular daily activities Slightly    Arm, shoulder, or hand pain. None    Tingling (pins and needles) in your arm, shoulder, or hand Mild    Difficulty Sleeping No difficulty    DASH Score 29.55 %              OBSERVATIONS: Both left breast and axillary incisions are healing well; moderate scar tissue tightness present at breast incision.  POSTURE:  Forward head and rounded shoulders  LYMPHEDEMA ASSESSMENT:   UPPER EXTREMITY AROM/PROM:   A/PROM RIGHT   eval    Shoulder extension 47  Shoulder flexion 153  Shoulder abduction 156  Shoulder internal rotation 78  Shoulder external rotation 62                          (Blank rows = not tested)   A/PROM LEFT   eval LEFT 01/01/2024  Shoulder extension 54 60  Shoulder flexion 146 148  Shoulder abduction 167 157  Shoulder internal rotation 73 60  Shoulder external rotation 80 73                          (Blank rows = not tested)   CERVICAL AROM: All within normal limits  UPPER EXTREMITY STRENGTH: WFL   LYMPHEDEMA ASSESSMENTS (in cm):    LANDMARK RIGHT   eval RIGHT 01/01/2024  10 cm proximal to olecranon process 26 25.8  Olecranon process 22.3 22.2  10 cm  proximal to ulnar styloid process 18.2 18.1  Just proximal to ulnar styloid process 13.8 13.8  Across hand at thumb web space 17.2 16.5  At base of 2nd digit 5.6 5.4  (Blank rows = not tested)   LANDMARK LEFT   eval LEFT 01/01/2024  10 cm proximal to olecranon process 26.2 26  Olecranon process 22.6 21.8  10 cm proximal to ulnar styloid process 18.1 17.5  Just proximal to ulnar styloid process 13.8 13.6  Across hand at thumb web space 16.7 16.2  At base of 2nd digit 5.4 5.3  (Blank rows = not tested)    Surgery type/Date: Left lumpectomy and sentinel node biopsy 12/07/2023 Number of lymph nodes removed: 2 Current/past treatment (chemo, radiation, hormone therapy): none Other symptoms:  Heaviness/tightness Yes Pain Yes Pitting edema No Infections No Decreased scar mobility Yes Stemmer sign No  PATIENT EDUCATION:  Education details: Pelvic floor rehab - what it entails and how it may benefit vaginal dryness; lymphedema risk reduction; chemo side effects related to what we see in PT; importance of hydration and exercise during chemo and radiation Person educated: Patient Education method: Explanation Education comprehension: verbalized understanding  HOME EXERCISE PROGRAM: Reviewed previously given post op HEP.   ASSESSMENT:  CLINICAL IMPRESSION: Patient is doing very well s/p left lumpectomy and sentinel node biopsy. She has concerns about chemotherapy and those were addressed within the PT's scope and she was encouraged to talk with her oncologist and neurologist about possible impact on her Parkinson's with chemo (if any). She has regained nearly full shoulder ROM and reports no issues with that. Her DASH was completed but she reports it mainly was done with respect to her right shoulder so is irrelevant. She reports she has returned to all baseline activities. There are no signs of lymphedema. Her incisions are well healed. There is no need for PT at this time except that she  will be seen by a pelvic floor therapist to address other issues.  Pt will benefit from skilled therapeutic intervention to improve on the following deficits: Decreased knowledge of precautions, impaired UE functional use, pain, decreased ROM, postural dysfunction.   PT treatment/interventions: ADL/Self care home management, [id number]- PT Re-evaluation, 97110-Therapeutic exercises, 97530- Therapeutic activity, and 32440- Self Care   GOALS: Goals reviewed with patient? Yes  LONG TERM GOALS:  (STG=LTG)  GOALS Name Target Date  Goal status  1 Pt will demonstrate she has regained full shoulder ROM and function post operatively compared to baselines.  Baseline: 01/17/2024 MET     PLAN:  PT FREQUENCY/DURATION: N/A  PLAN FOR NEXT SESSION: N/A   [facility]  [address], [address]  [place] [address]  [phone]  After Breast Cancer Class Video It is recommended you view the ABC class video to be educated on lymphedema risk reduction. This video lasts for about 30 minutes. It can be viewed on our website here: [email or web address]  Scar massage You can begin gentle scar massage to you incision sites. Gently place one hand on the incision and move the skin (without sliding on the skin) in various directions. Do this for a few minutes and then you can gently massage either coconut oil or vitamin E cream into the scars.  Compression garment You should continue  wearing your compression bra until you feel like you no longer have swelling.  Home exercise Program Continue doing the exercises you were given until you feel like you can do them without feeling any tightness at the end.   Walking Program Studies show that 30 minutes of walking per day (fast enough to elevate your heart rate) can significantly reduce the risk of a cancer recurrence. If you can't walk due to other medical reasons, we  encourage you to find another activity you could do (like a stationary bike or water exercise).  Posture After breast cancer surgery, people frequently sit with rounded shoulders posture because it puts their incisions on slack and feels better. If you sit like this and scar tissue forms in that position, you can become very tight and have pain sitting or standing with good posture. Try to be aware of your posture and sit and stand up tall to heal properly.  Follow up PT: It is recommended you return every 3 months for the first 3 years following surgery to be assessed on the SOZO machine for an L-Dex score. This helps prevent clinically significant lymphedema in 95% of patients. These follow up screens are 10 minute appointments that you are not billed for.  PHYSICAL THERAPY DISCHARGE SUMMARY  Visits from Start of Care: 2  Current functional level related to goals / functional outcomes: Goals met; see above for objective findings   Remaining deficits: None related to her recent surgery   Education / Equipment: HEP and lymphedema education.   Patient agrees to discharge. Patient goals were met. Patient is being discharged due to meeting the stated rehab goals.  Bethann Punches, [place] 01/01/24 3:11 PM

## 2024-01-03 ENCOUNTER — Other Ambulatory Visit: Payer: Self-pay | Admitting: Neurology

## 2024-01-03 ENCOUNTER — Telehealth: Payer: Self-pay | Admitting: Hematology and Oncology

## 2024-01-03 ENCOUNTER — Inpatient Hospital Stay: Admitting: Hematology and Oncology

## 2024-01-03 ENCOUNTER — Encounter: Payer: Self-pay | Admitting: *Deleted

## 2024-01-03 ENCOUNTER — Ambulatory Visit: Payer: Medicare Other | Admitting: Hematology and Oncology

## 2024-01-03 DIAGNOSIS — Z170 Estrogen receptor positive status [ER+]: Secondary | ICD-10-CM | POA: Diagnosis not present

## 2024-01-03 DIAGNOSIS — C50212 Malignant neoplasm of upper-inner quadrant of left female breast: Secondary | ICD-10-CM | POA: Diagnosis not present

## 2024-01-03 NOTE — Progress Notes (Signed)
 [facility] CONSULT NOTE  Patient Care Team: Jarrett Soho, PA-C as PCP - General (Family Medicine) Pershing Proud, RN as Oncology Nurse Navigator Donnelly Angelica, RN as Oncology Nurse Navigator Rachel Moulds, MD as Consulting Physician (Hematology and Oncology)  CHIEF COMPLAINTS/PURPOSE OF CONSULTATION:  Newly diagnosed breast cancer  HISTORY OF PRESENTING ILLNESS:  Breanna Powers 68 y.o. female is here because of recent diagnosis of left breast cancer.  I reviewed her records extensively and collaborated the history with the patient.  SUMMARY OF ONCOLOGIC HISTORY: Oncology History  Malignant neoplasm of upper-inner quadrant of left breast in female, estrogen receptor positive (HCC)  10/09/2023 Mammogram   Mammogram showed possible asymmetry in the right breast and left breast hence diagnostic mammogram and ultrasound were recommended.  Diagnostic mammogram once again confirmed a 5 x 4 x 4 mm irregular hypoechoic mass left breast at 9 o'clock position 6 cm from the nipple.  No left axillary adenopathy.  Questioned asymmetry in the right breast resolved with additional imaging compatible with dense overlapping fibroglandular tissue.   11/14/2023 Pathology Results   Left breast needle core biopsy at 9:00 showed invasive ductal carcinoma, overall grade 2, prognostics showed ER 95% positive strong staining PR 60% positive weak to moderate staining, Ki-67 of 10% and HER2 1+.   11/20/2023 Initial Diagnosis   Malignant neoplasm of upper-inner quadrant of left breast in female, estrogen receptor positive (HCC)   11/22/2023 Cancer Staging   Staging form: Breast, AJCC 8th Edition - Pathologic stage from 11/22/2023: Stage IA (pT1a, pN0, cM0, G2, ER+, PR+, HER2-) - Signed by Rachel Moulds, MD on 11/22/2023 Stage prefix: Initial diagnosis Histologic grading system: 3 grade system Laterality: Left Stage used in treatment planning: Yes National guidelines used in  treatment planning: Yes Type of national guideline used in treatment planning: NCCN   11/30/2023 Genetic Testing   Negative [facility] CancerNext+RNAinsight Panel.  Report date is 11/30/2023.  The [facility] CancerNext+RNAinsight Panel includes sequencing, rearrangement analysis, and RNA analysis for the following 39 genes: APC, ATM, BAP1, BARD1, BMPR1A, BRCA1, BRCA2, BRIP1, CDH1, CDKN2A, CHEK2, FH, FLCN, MET, MLH1, MSH2, MSH6, MUTYH, NF1, NTHL1, PALB2, PMS2, PTEN, RAD51C, RAD51D, SMAD4, STK11, TP53, TSC1, TSC2, and VHL (sequencing and deletion/duplication); AXIN2, HOXB13, MBD4, MSH3, POLD1 and POLE (sequencing only); EPCAM and GREM1 (deletion/duplication only).    01/17/2024 -  Chemotherapy   Patient is on Treatment Plan : BREAST TC q21d      Patient arrived to the appointment today with her husband. Discussed the use of AI scribe software for clinical note transcription with the patient, who gave verbal consent to proceed.  History of Present Illness    Breanna Powers is a 68 year old female with breast cancer who presents for  follow up telephone visit since we last discussed oncotype results. She tells me that she made up her mind and she wants to try chemotherapy.  Rest of the pertinent 10 point ROS reviewed and neg.  MEDICAL HISTORY:  Past Medical History:  Diagnosis Date   Dermatitis    Eczema    Fibroid    Hemiparesis (HCC)    right side "part of the parkinson's" also had a muscle tear repair in R shoulder   Hypertension    Knee pain    Menopause    Parkinson disease (HCC)     SURGICAL HISTORY: Past Surgical History:  Procedure Laterality Date   BREAST BIOPSY Left 11/14/2023   [place] LT BREAST BX W LOC DEV 1ST  LESION IMG BX SPEC US GUIDE 11/14/2023 [facility]   BREAST BIOPSY Left 12/06/2023   [place] LT RADIOACTIVE SEED LOC 12/06/2023 [facility]   BREAST LUMPECTOMY WITH RADIOACTIVE SEED AND SENTINEL LYMPH NODE BIOPSY Left 12/07/2023   Procedure: LEFT SEED LUMPECTOMY AND  SENTINEL LYMPH NODE BIOPSY;  Surgeon: Almond Lint, MD;  Location: [facility];  Service: General;  Laterality: Left;  GEN w/REGIONAL   COLONOSCOPY     2021   CYSTOSCOPY     10/2019   SHOULDER ARTHROSCOPY Right    2018 W biceps tenodesis   TOTAL ABDOMINAL HYSTERECTOMY     10-29-2019   TRIGGER FINGER RELEASE Left    09/29/2020   tummy tuck     04-2023   WISDOM TOOTH EXTRACTION      SOCIAL HISTORY: Social History   Socioeconomic History   Marital status: Married    Spouse name: Not on file   Number of children: 0   Years of education: Assoc   Highest education level: Not on file  Occupational History   Occupation: [employer]  Tobacco Use   Smoking status: Never   Smokeless tobacco: Never  Vaping Use   Vaping status: Never Used  Substance and Sexual Activity   Alcohol use: Yes   Drug use: Never   Sexual activity: Not on file    Comment: TAH  Other Topics Concern   Not on file  Social History Narrative   Patient drinks 3-4 cups of coffee a day    Associates degree   Right handed   Goes to the gym for exercise 3-4 times per week   Social Drivers of Health   Financial Resource Strain: Low Risk  (03/08/2023)   Received from [facility], [facility]   Overall Financial Resource Strain (CARDIA)    Difficulty of Paying Living Expenses: Not hard at all  Food Insecurity: No Food Insecurity (11/22/2023)   Hunger Vital Sign    Worried About Running Out of Food in the Last Year: Never true    Ran Out of Food in the Last Year: Never true  Transportation Needs: No Transportation Needs (11/22/2023)   PRAPARE - Administrator, Civil Service (Medical): No    Lack of Transportation (Non-Medical): No  Physical Activity: Insufficiently Active (08/10/2021)   Received from [facility] visits prior to 12/24/2022., [facility] visits prior to 12/24/2022.   Exercise Vital Sign    Days of Exercise per Week: 1 day     Minutes of Exercise per Session: 60 min  Stress: Stress Concern Present (08/10/2021)   Received from [facility] visits prior to 12/24/2022., [facility] visits prior to 12/24/2022.   [employer] of Occupational Health - Occupational Stress Questionnaire    Feeling of Stress : Rather much  Social Connections: Unknown (02/07/2023)   Received from [facility], [facility]   Social Network    Social Network: Not on file  Intimate Partner Violence: Not At Risk (11/22/2023)   Humiliation, Afraid, Rape, and Kick questionnaire    Fear of Current or Ex-Partner: No    Emotionally Abused: No    Physically Abused: No    Sexually Abused: No    FAMILY HISTORY: Family History  Problem Relation Age of Onset   Neuropathy Mother    Dementia Mother    Depression Mother    Hypertension Mother    Restless legs syndrome Mother  Parkinson's disease Father    Heart disease Father    Breast cancer Sister 34       d. 59   Atrial fibrillation Sister    Osteoporosis Sister    Heart disease Sister    Cancer Maternal Aunt        unk type; ? pancreatic; mets dx >50   Breast cancer Paternal Aunt        dx >50   Breast cancer Paternal Grandmother        mets; d. 54s   Heart disease Paternal Grandfather     ALLERGIES:  has no known allergies.  MEDICATIONS:  Current Outpatient Medications  Medication Sig Dispense Refill   amantadine (SYMMETREL) 100 MG capsule Take 1 capsule (100 mg total) by mouth 2 (two) times daily. 60 capsule 3   B Complex Vitamins (VITAMIN B COMPLEX) CAPS Take 1 capsule by mouth daily at 6 (six) AM. (Patient not taking: Reported on 01/01/2024)     Calcium Carb-Cholecalciferol (CALCIUM 600/VITAMIN D3) 600-20 MG-MCG TABS Take by mouth 2 (two) times daily.     carbidopa-levodopa (SINEMET CR) 50-200 MG tablet Take 1 tablet by mouth at bedtime. 30 tablet 5   carbidopa-levodopa (SINEMET IR) 25-100 MG tablet TAKE 1 TABLET FOUR TIMES A DAY  360 tablet 3   Cholecalciferol (VITAMIN D3) 1000 units CAPS Take 2,000 Units by mouth.     losartan (COZAAR) 25 MG tablet Take 25 mg by mouth daily.     Lutein 20 MG TABS Take 1 tablet by mouth daily after supper. (Patient not taking: Reported on 01/01/2024)     Multiple Vitamin (MULTIVITAMIN) tablet Take by mouth.     oxyCODONE (OXY IR/ROXICODONE) 5 MG immediate release tablet Take 0.5-1 tablets (2.5-5 mg total) by mouth every 6 (six) hours as needed for severe pain (pain score 7-10). (Patient not taking: Reported on 01/01/2024) 8 tablet 0   pramipexole (MIRAPEX) 0.5 MG tablet Take 0.5 mg by mouth 4 (four) times daily.     vitamin B-12 (CYANOCOBALAMIN) 500 MCG tablet Take 500 mcg by mouth daily.     Vitamin E 180 MG (400 UNIT) CAPS Take 180 mg by mouth. (Patient not taking: Reported on 01/01/2024)     No current facility-administered medications for this visit.    REVIEW OF SYSTEMS:   Constitutional: Denies fevers, chills or abnormal night sweats Eyes: Denies blurriness of vision, double vision or watery eyes Ears, nose, mouth, throat, and face: Denies mucositis or sore throat Respiratory: Denies cough, dyspnea or wheezes Cardiovascular: Denies palpitation, chest discomfort or lower extremity swelling Gastrointestinal:  Denies nausea, heartburn or change in bowel habits Skin: Denies abnormal skin rashes Lymphatics: Denies new lymphadenopathy or easy bruising Neurological:Denies numbness, tingling or new weaknesses Behavioral/Psych: Mood is stable, no new changes  Breast: Denies any palpable lumps or discharge All other systems were reviewed with the patient and are negative.  PHYSICAL EXAMINATION: ECOG PERFORMANCE STATUS: 0 - Asymptomatic  There were no vitals filed for this visit.  There were no vitals filed for this visit.   GENERAL:alert, no distress and comfortable  LABORATORY DATA:  I have reviewed the data as listed Lab Results  Component Value Date   WBC 6.4 11/22/2023    HGB 13.2 11/22/2023   HCT 39.1 11/22/2023   MCV 95.6 11/22/2023   PLT 247 11/22/2023   Lab Results  Component Value Date   NA 139 11/22/2023   K 4.5 11/22/2023   CL 106 11/22/2023   CO2 29  11/22/2023    RADIOGRAPHIC STUDIES: I have personally reviewed the radiological reports and agreed with the findings in the report.  ASSESSMENT AND PLAN:   Malignant neoplasm of upper-inner quadrant of left breast in female, estrogen receptor positive (HCC) Breast Cancer Small tumor with positive posterior margin following surgery.  Oncotype score of 27, indicating a potential benefit from chemotherapy. Discussed the risks and benefits of chemotherapy, including potential side effects and impact on quality of life. She is agreeable to proceed with adj TC , orders placed Chemo teach to be scheduled. She will proceed via peripheral IV  FU to be scheduled.   Time spent:10 min  I connected with  Myra Rude on 01/03/24 by a telephone application and verified that I am speaking with the correct person using two identifiers.   I discussed the limitations of evaluation and management by telemedicine. The patient expressed understanding and agreed to proceed.  All questions were answered. The patient knows to call the clinic with any problems, questions or concerns. Location of provider: office Location of patient: Home.    Rachel Moulds, MD 01/03/24

## 2024-01-03 NOTE — Assessment & Plan Note (Addendum)
 Breast Cancer Small tumor with positive posterior margin following surgery.  Oncotype score of 27, indicating a potential benefit from chemotherapy. Discussed the risks and benefits of chemotherapy, including potential side effects and impact on quality of life. She is agreeable to proceed with adj TC , orders placed Chemo teach to be scheduled. She will proceed via peripheral IV  FU to be scheduled.

## 2024-01-03 NOTE — Telephone Encounter (Signed)
 Patient is aware of scheduled appointment with patient education and also aware of scheduled appointments for first cycle of treatment, patient is aware that they will receive a calendar of future scheduled appointments for infusions and follow ups; patient has callback number if any appointments are needing to be cancelled or rescheduled

## 2024-01-03 NOTE — Progress Notes (Signed)
 START ON PATHWAY REGIMEN - Breast     A cycle is every 21 days:     Cyclophosphamide      Docetaxel   **Always confirm dose/schedule in your pharmacy ordering system**  Patient Characteristics: Postoperative without Neoadjuvant Therapy, M0 (Pathologic Staging), Invasive Disease, Adjuvant Therapy, HER2 Negative, ER Positive, Node Negative, pT1a, pN65mi or pT1b-c, pN0/N52mi, Oncotype High Risk (? 26) Therapeutic Status: Postoperative without Neoadjuvant Therapy, M0 (Pathologic Staging) AJCC Grade: G2 AJCC N Category: pN0 AJCC M Category: cM0 ER Status: Positive (+) AJCC 8 Stage Grouping: IA HER2 Status: Negative (-) Oncotype Dx Recurrence Score: 27 AJCC T Category: pT1b PR Status: Positive (+) Has this patient completed genomic testing<= Yes - Oncotype DX(R) Intent of Therapy: Curative Intent, Discussed with Patient

## 2024-01-04 ENCOUNTER — Encounter: Payer: Self-pay | Admitting: Hematology and Oncology

## 2024-01-04 ENCOUNTER — Other Ambulatory Visit: Payer: Self-pay

## 2024-01-04 ENCOUNTER — Telehealth: Payer: Self-pay | Admitting: *Deleted

## 2024-01-04 ENCOUNTER — Ambulatory Visit: Attending: Hematology and Oncology

## 2024-01-04 DIAGNOSIS — R293 Abnormal posture: Secondary | ICD-10-CM | POA: Insufficient documentation

## 2024-01-04 DIAGNOSIS — N898 Other specified noninflammatory disorders of vagina: Secondary | ICD-10-CM | POA: Diagnosis not present

## 2024-01-04 DIAGNOSIS — R279 Unspecified lack of coordination: Secondary | ICD-10-CM | POA: Insufficient documentation

## 2024-01-04 DIAGNOSIS — M6281 Muscle weakness (generalized): Secondary | ICD-10-CM | POA: Diagnosis not present

## 2024-01-04 NOTE — Telephone Encounter (Signed)
 [id number]- Effectiveness of Out-of Pocket Cost Communication and Financial Navigation (CostCOM) in Cancer Patients:   Called patient to follow up on the above study. Left VM with my phone number asking patient to return call at her convenience.  Domenica Reamer, BSN, RN, [employer] Clinical Research Nurse II [phone] 01/04/2024 1:15 PM

## 2024-01-04 NOTE — Patient Instructions (Addendum)
 Lubrication Used for intercourse to reduce friction Avoid ones that have glycerin, nonoxynol-9, petroleum, propylene glycol, chlorhexidine gluconate, warming gels, tingling gels, icing or cooling gel, scented Avoid parabens due to a preservative similar to female sex hormone May need to be reapplied once or several times during sexual activity Can be applied to both partners genitals prior to vaginal penetration to minimize friction or irritation Prevent irritation and mucosal tears that cause post coital pain and increased the risk of vaginal and urinary tract infections Oil-based lubricants cannot be used with condoms due to breaking them down.  Least likely to irritate vaginal tissue.  Plant based-lubes are safe Silicone-based lubrication are thicker and last long and used for post-menopausal women  Vaginal Lubricators Here is a list of some suggested lubricators you can use for intercourse. Use the most hypoallergenic product.  You can place on you or your partner.  Slippery Stuff ( water based) Sylk or Sliquid Natural H2O ( good  if frequent UTI's)- walmart, amazon Sliquid organics silk-(aloe and silicone based ) [employer] (www.blossom-organics.com)- (aloe based ) Coconut oil, olive oil -not good with condoms  PJur Woman Nude- (water based) amazon Uberlube- ( silicon) Amazon Aloe Vera- Sprouts has an organic one Yes lubricant- (water based and has plant oil based similar to silicone) Loews [employer] Platinum-Silicone, Target, [facility] Olive and Bee intimate cream-  www.oliveandbee.com.au Pink - International [employer] Erosense Sync- walmart, amazon Coconu- coconu.com - no samples, but love this one Terex Corporation - great option, we don't have samples  Good Clean Love lubricants  Things to avoid in lubricants are glycerin, warming gels, tingling gels, icing or cooling  gels, and scented gels.  Also avoid Vaseline. KY jelly,  and Astroglide contain chlorhexidine which kills good  bacteria(lactobacilli)  Things to avoid in the vaginal area Do not use things to irritate the vulvar area No lotions- see below Soaps you  can use :Aveeno, Calendula, Good Clean Love cleanser if needed. Must be gentle No deodorants No douches Good to sleep without underwear to let the vaginal area to air out No scrubbing: spread the lips to let warm water rinse over labias and pat dry  Creams that can be used on the Vulva Area V [employer], walmart Vital V Wild Yam Salve Julva- [employer] Botanical Pro-Meno Wild Yam Cream Coconut oil, olive oil Cleo by [employer] labial moisturizer -Amazon,  Desert [place] Releveum ( lidocaine) or Desert Fluor Corporation Yes Moisturizer    ______________________________________________   VF Corporation They are used in the vagina to hydrate the mucous membrane that make up the vaginal canal. Designed to keep a more normal acid balance (ph) Once placed in the vagina, it will last between two to three days.  Use 2-3 times per week at bedtime  Ingredients to avoid is glycerin and fragrance, can increase chance of infection Should not be used just before sex due to causing irritation Most are gels administered either in a tampon-shaped applicator or as a vaginal suppository. They are non-hormonal.   Types of Moisturizers(internal use)  Vitamin E vaginal suppositories- Whole foods, Amazon Moist Again Coconut oil- can break down condoms, any grocery store (prefer organic) Julva- (Do no use if taking  Tamoxifen) amazon Yes moisturizer- amazon NeuEve Silk , NeuEve Silver for menopausal or over 65 (if have severe vaginal atrophy or cancer treatments use NeuEve Silk for  1 month than move to [employer])- [employer] Corporation, [place].com Olive and Bee intimate cream- www.oliveandbee.com.au Mae vaginal moisturizer- Amazon Aloe Good Clean Love Hyaluronic  acid Hyalofemme Reveree hyaluronic acid inserts   Creams to use externally on the Vulva area [employer] One (good for for cancer patients that had radiation to the area)- [place] or [employer].https://garcia-valdez.org/ Vulva Balm/ V-magic cream by medicine mama- amazon Julva-amazon Vital "V Wild Yam salve ( help moisturize and help with thinning vulvar area, does have Beeswax MoodMaid Botanical Pro-Meno Wild Yam Cream- Amazon Desert Harvest Gele Cleo by Zane Herald labial moisturizer (Amazon),  Coconut or olive oil aloe Good Clean Love Enchanted Rose by intimate rose  Things to avoid in the vaginal area Do not use things to irritate the vulvar area No lotions just specialized creams for the vulva area- Neogyn, V-magic,  No soaps; can use Aveeno or Calendula cleanser, unscented Dove if needed. Must be gentle No deodorants No douches Good to sleep without underwear to let the vaginal area to air out No scrubbing: spread the lips to let warm water rinse over labias and pat dry   _____________________________________________   Vulvar/vaginal Massage: This is a technique to help decrease painful sensitivity in the vaginal area. It can also help to restore normal moisture levels in the vaginal tissues. With coconut oil, aloe, jojoba oil, or a specific vaginal moisturizer, gently massage into vaginal tissues. Think of this as part of your post-shower routine and moisturizing just like you would the rest of the body with lotion. This helps to increase good blood flow to the vaginal tissues. In addition, it also teaches the body that touch to the vagina does not have to be painful or threatening, but moisturizing and gentle.   _____________________________________________   Double-voiding:  This technique is to help with post-void dribbling, or leaking a little bit when you stand up right after urinating. Use relaxed toileting mechanics to urinate as much as you feel like you have to without straining. Sit back upright from leaning forward and relax this way for 10-20 seconds. Lean  forward again to finish voiding any amount more.   ____________________________________________   Urge Incontinence  Ideal urination frequency is every 2-4 wakeful hours, which equates to 5-8 times within a 24-hour period.   Urge incontinence is leakage that occurs when the bladder muscle contracts, creating a sudden need to go before getting to the bathroom.   Going too often when your bladder isn't actually full can disrupt the body's automatic signals to store and hold urine longer, which will increase urgency/frequency.  In this case, the bladder "is running the show" and strategies can be learned to retrain this pattern.   One should be able to control the first urge to urinate, at around .  The bladder can hold up to a "grande latte," or . To help you gain control, practice the Urge Drill below when urgency strikes.  This drill will help retrain your bladder signals and allow you to store and hold urine longer.  The overall goal is to stretch out your time between voids to reach a more manageable voiding schedule.    Practice your "quick flicks" often throughout the day (each waking hour) even when you don't need feel the urge to go.  This will help strengthen your pelvic floor muscles, making them more effective in controlling leakage.  Urge Drill  When you feel an urge to go, follow these steps to regain control: Stop what you are doing and be still Take one deep breath, directing your air into your abdomen Think an affirming thought, such as "I've got this." Do 5 quick flicks  of your pelvic floor Walk with control to the bathroom to void, or delay voiding   [facility] [address] [place], [place] [address] Phone # [phone] Fax [phone]

## 2024-01-04 NOTE — Therapy (Signed)
 OUTPATIENT PHYSICAL THERAPY FEMALE PELVIC EVALUATION   Patient Name: Breanna Powers MRN: [id number] DOB:[birth year], 68 y.o., female Today's Date: 01/04/2024  END OF SESSION:  PT End of Session - 01/04/24 1016     Visit Number 3   PF #1   Date for PT Re-Evaluation 56/43/32   PF cert date   Authorization Type BCBS Medicare    Progress Note Due on Visit 10    PT Start Time 1015    PT Stop Time 1055    PT Time Calculation (min) 40 min    Activity Tolerance Patient tolerated treatment well    Behavior During Therapy WFL for tasks assessed/performed             Past Medical History:  Diagnosis Date   Dermatitis    Eczema    Fibroid    Hemiparesis (HCC)    right side "part of the parkinson's" also had a muscle tear repair in R shoulder   Hypertension    Knee pain    Menopause    Parkinson disease (HCC)    Past Surgical History:  Procedure Laterality Date   BREAST BIOPSY Left 11/14/2023   [place] LT BREAST BX W LOC DEV 1ST LESION IMG BX SPEC US GUIDE 11/14/2023 [facility]   BREAST BIOPSY Left 12/06/2023   [place] LT RADIOACTIVE SEED LOC 12/06/2023 [facility]   BREAST LUMPECTOMY WITH RADIOACTIVE SEED AND SENTINEL LYMPH NODE BIOPSY Left 12/07/2023   Procedure: LEFT SEED LUMPECTOMY AND SENTINEL LYMPH NODE BIOPSY;  Surgeon: Almond Lint, MD;  Location: [facility];  Service: General;  Laterality: Left;  GEN w/REGIONAL   COLONOSCOPY     2021   CYSTOSCOPY     10/2019   SHOULDER ARTHROSCOPY Right    2018 W biceps tenodesis   TOTAL ABDOMINAL HYSTERECTOMY     10-29-2019   TRIGGER FINGER RELEASE Left    09/29/2020   tummy tuck     04-2023   WISDOM TOOTH EXTRACTION     Patient Active Problem List   Diagnosis Date Noted   Genetic testing 11/30/2023   Malignant neoplasm of upper-inner quadrant of left breast in female, estrogen receptor positive (HCC) 11/20/2023   Hemiparesis (HCC) 06/30/2016   KNEE PAIN 09/16/2008   ITBS, RIGHT KNEE 09/16/2008   UNEQUAL LEG  LENGTH 09/16/2008    PCP: Jarrett Soho, PA-C  REFERRING PROVIDER: Rachel Moulds, MD   REFERRING DIAG: N89.8 (ICD-10-CM) - Vaginal dryness  THERAPY DIAG:  Muscle weakness (generalized)  Unspecified lack of coordination  Abnormal posture  Rationale for Evaluation and Treatment: Rehabilitation  ONSET DATE: 1 year  SUBJECTIVE:  SUBJECTIVE STATEMENT: Pt is undergoing treatment for breast cancer that is estrogen positive.  Fluid intake: "not enough"  PAIN:  Are you having pain? Yes NPRS scale: 1/10 Pain location:  Lt axilla  Pain type: tight and achy Pain description: intermittent   Aggravating factors: unknown Relieving factors: unknown  PRECAUTIONS: None and Other: Breast cancer with recent surgery, Lt UE lymphedema risk  RED FLAGS: None   WEIGHT BEARING RESTRICTIONS: No  FALLS:  Has patient fallen in last 6 months? No  OCCUPATION: retired  ACTIVITY LEVEL : taking a yoga class 2x/week, lifts weights, walking   PLOF: Independent  PATIENT GOALS: be able to have comfortable intercourse   PERTINENT HISTORY:  Patient was diagnosed on 11/16/2023 with left grade 2 invasive ductal carcinoma breast cancer. She underwent a left lumpectomy and sentinel node biopsy on 12/07/2023 (2 negative nodes). It is ER/PR positive and HER2 negative with a Ki67 of 10%. She has a right shoulder muscle repair in 2018 resulting in right shoulder tightness. She also has well-managed Parkinson's. Total hysterectomy 2021. Abdominal skin removal surgery.    BOWEL MOVEMENT: Pain with bowel movement: No Type of bowel movement:Frequency 1x/day and Strain not often Fully empty rectum: Yes: - Leakage: Yes: comes and goes  Pads: Yes: 1 a day Fiber supplement/laxative No  URINATION: Pain with urination:  No Fully empty bladder: Yes: but has to go 10 minutes afterwards Stream: Strong Urgency: Yes  Frequency: every 45 minutes during the day; sometimes wakes 1x/night Leakage: Urge to void and Walking to the bathroom Pads: Yes: 1 pad a day  INTERCOURSE:  Ability to have vaginal penetration Yes  Pain with intercourse: Initial Penetration and During Penetration DrynessYes  Climax: taking longer and more difficult Marinoff Scale: 2/3 Lubricant:yes, using KY  PREGNANCY: NA  PROLAPSE: None   OBJECTIVE:  Note: Objective measures were completed at Evaluation unless otherwise noted.  01/04/24:  PATIENT SURVEYS:   PFIQ-7: 39  COGNITION: Overall cognitive status: Within functional limits for tasks assessed     SENSATION: Light touch: Appears intact   GAIT: Assistive device utilized: None Comments: WNL  POSTURE: rounded shoulders, forward head, decreased lumbar lordosis, increased thoracic kyphosis, and posterior pelvic tilt    PALPATION:   General: WNL  Abdominal: lower abdominal scar tissue                External Perineal Exam: dryness, some labial fusion                             Internal Pelvic Floor: atrophy, no pain  Patient confirms identification and approves PT to assess internal pelvic floor and treatment Yes  PELVIC MMT:   MMT eval  Vaginal 1-2/5, 4 second hold, 6 repeat contractions  Diastasis Recti 2 finger widths  (Blank rows = not tested)        TONE: low  PROLAPSE: WNL  TODAY'S TREATMENT:  DATE:  01/04/24  EVAL  Neuromuscular re-education: Pt provides verbal consent for internal vaginal/rectal pelvic floor exam. Internal vaginal pelvic floor muscle contraction training Quick flicks Long holds Urge drill Therapeutic activities: Being well hydrated to help tissue moisture Orgasm at least 1-2x/week for good  vaginal health Use of vibrator to help circulation Vaginal lubricants/moisturizers Double voiding    PATIENT EDUCATION:  Education details: See above Person educated: Patient Education method: Explanation, Demonstration, Tactile cues, Verbal cues, and Handouts Education comprehension: verbalized understanding  HOME EXERCISE PROGRAM: [id number]  ASSESSMENT:  CLINICAL IMPRESSION: Patient is a 68 y.o. female who was seen today for physical therapy evaluation and treatment for vaginal dryness and decreased bladder/bowel control. Exam findings notable for abnormal posture, evidence of labial fusion, vulvar and vaginal dryness, pelvic floor muscle atrophy, pelvic floor muscle weakness and decreased endurance, poor pelvic floor muscle coordination. Signs and symptoms are most consistent with pelvic floor muscle weakness and dry vulvovaginal tissues. Initial treatment consisted of Pt education on double voiding, vaginal lubricants/moisturizers, use of vibrator to help with circulation, orgasm on regular basis to increase circulation, appropriate hydration, and urge drill; we also performed pelvic floor muscle contraction training to for improved strength and coordination. She may continue to benefit from skilled PT intervention in order to improve bowel/bladder control and vaginal dryness; she will call after chemotherapy if she feels like she still needs assistance.    OBJECTIVE IMPAIRMENTS: decreased activity tolerance, decreased coordination, decreased endurance, decreased mobility, decreased safety awareness, increased fascial restrictions, increased muscle spasms, impaired tone, postural dysfunction, and pain.   ACTIVITY LIMITATIONS: continence  PARTICIPATION LIMITATIONS: interpersonal relationship  PERSONAL FACTORS: 3+ comorbidities: medical history  are also affecting patient's functional outcome.   REHAB POTENTIAL: Good  CLINICAL DECISION MAKING: Evolving/moderate  complexity  EVALUATION COMPLEXITY: Moderate   GOALS: Goals reviewed with patient? Yes  SHORT TERM GOALS: Target date: 02/01/2024   Pt will be independent with HEP.   Baseline: Goal status: INITIAL  2.  Pt will be able to teach back and utilize urge suppression technique in order to help reduce number of trips to the bathroom.    Baseline:  Goal status: INITIAL  3.  Pt will be independent with double voiding in order to more completely empty bladder and not return to the bathroom within 10 minutes of voiding.  Baseline:  Goal status: INITIAL  4.  Pt will use vaginal moisturizers internally and perform vulvovaginal massage externally at least 5x/week for improved vulvovaginal health.  Baseline:  Goal status: INITIAL  5.  Pt will increase frequency of orgasm to 1-2/week in order to improve vaginal health.  Baseline:  Goal status: INITIAL   LONG TERM GOALS: Target date: 03/28/24  Pt will be independent with advanced HEP.   Baseline:  Goal status: INITIAL  2.  Pt will demonstrate normal pelvic floor muscle tone and A/ROM, able to achieve 4/5 strength with contractions and 10 sec endurance, in order to provide appropriate lumbopelvic support in functional activities.   Baseline:  Goal status: INITIAL  3.  Pt will be able to go 2-3 hours in between voids without urgency or incontinence in order to improve QOL and perform all functional activities with less difficulty.   Baseline:  Goal status: INITIAL  4.  Pt will report 0/10 pain with vaginal penetration in order to improve intimate relationship with partner.    Baseline:  Goal status: INITIAL  5.  Pt will report no bothersome vaginal dryness.  Baseline:  Goal status: INITIAL  PLAN:  PT FREQUENCY: 1-2x/week  PT DURATION: 12 weeks  PLANNED INTERVENTIONS: 97110-Therapeutic exercises, 97530- Therapeutic activity, 97112- Neuromuscular re-education, 97535- Self Care, [address]- Manual therapy, Dry Needling, and  Biofeedback  PLAN FOR NEXT SESSION: Pt will finish chemotherapy and then assess if she needs to return or not at that time. If she returns we will plan to continue pelvic floor muscle strengthening activities and core training.    Julio Alm, PT, DPT03/13/2512:31 PM

## 2024-01-05 ENCOUNTER — Telehealth: Payer: Self-pay | Admitting: *Deleted

## 2024-01-05 NOTE — Telephone Encounter (Signed)
 [id number]- Effectiveness of Out-of Pocket Cost Communication and Financial Navigation (CostCOM) in Cancer Patients:  Patient returned call to this research nurse regarding the above study.  Patient states she is interested in participating in the above study but has not had a chance to read the consent form yet. Patient agreed to meet with research nurse next week on 01/12/24 prior to her patient education class to review the study and consent if she still wants to participate after reading the consent. Encouraged patient to read consent at home prior to this visit and call back if any questions before next Friday. Patient verbalized understanding.  Domenica Reamer, BSN, RN, [employer] Clinical Research Nurse II [phone] 01/05/2024 12:34 PM

## 2024-01-08 ENCOUNTER — Encounter: Payer: Self-pay | Admitting: *Deleted

## 2024-01-08 DIAGNOSIS — C50212 Malignant neoplasm of upper-inner quadrant of left female breast: Secondary | ICD-10-CM

## 2024-01-09 NOTE — Progress Notes (Signed)
 Pharmacist Chemotherapy Monitoring - Initial Assessment    Anticipated start date: 01/16/24   The following has been reviewed per standard work regarding the patient's treatment regimen: The patient's diagnosis, treatment plan and drug doses, and organ/hematologic function Lab orders and baseline tests specific to treatment regimen  The treatment plan start date, drug sequencing, and pre-medications Prior authorization status  Patient's documented medication list, including drug-drug interaction screen and prescriptions for anti-emetics and supportive care specific to the treatment regimen The drug concentrations, fluid compatibility, administration routes, and timing of the medications to be used The patient's access for treatment and lifetime cumulative dose history, if applicable  The patient's medication allergies and previous infusion related reactions, if applicable   Changes made to treatment plan:  N/A  Follow up needed:  Pending authorization for treatment   Drusilla Kanner, PharmD, MBA

## 2024-01-10 ENCOUNTER — Other Ambulatory Visit: Payer: Self-pay | Admitting: *Deleted

## 2024-01-10 ENCOUNTER — Encounter: Payer: Self-pay | Admitting: Hematology and Oncology

## 2024-01-10 DIAGNOSIS — C50212 Malignant neoplasm of upper-inner quadrant of left female breast: Secondary | ICD-10-CM

## 2024-01-10 MED ORDER — DEXAMETHASONE 4 MG PO TABS: ORAL_TABLET | ORAL | 1 refills | Status: DC

## 2024-01-10 MED ORDER — LIDOCAINE-PRILOCAINE 2.5-2.5 % EX CREA
TOPICAL_CREAM | CUTANEOUS | 3 refills | Status: DC
Start: 2024-01-10 — End: 2024-06-18

## 2024-01-10 MED ORDER — ONDANSETRON HCL 8 MG PO TABS: 8.0000 mg | ORAL_TABLET | Freq: Three times a day (TID) | ORAL | 1 refills | Status: DC | PRN

## 2024-01-10 MED ORDER — PROCHLORPERAZINE MALEATE 10 MG PO TABS: 10.0000 mg | ORAL_TABLET | Freq: Four times a day (QID) | ORAL | 1 refills | Status: DC | PRN

## 2024-01-11 ENCOUNTER — Encounter: Payer: Self-pay | Admitting: Hematology and Oncology

## 2024-01-11 NOTE — Progress Notes (Signed)
 error

## 2024-01-12 ENCOUNTER — Inpatient Hospital Stay

## 2024-01-12 ENCOUNTER — Telehealth: Payer: Self-pay

## 2024-01-12 ENCOUNTER — Inpatient Hospital Stay: Admitting: *Deleted

## 2024-01-12 DIAGNOSIS — C50212 Malignant neoplasm of upper-inner quadrant of left female breast: Secondary | ICD-10-CM

## 2024-01-12 NOTE — Research (Signed)
 [id number]- Effectiveness of Out-of Pocket Cost Communication and Financial Navigation (CostCOM) in Cancer Patients:   This Nurse has reviewed this patient's inclusion and exclusion criteria as a second review and confirms Breanna Powers is eligible for study participation.  Patient may continue with enrollment.  Zerita Boers BSN RN Clinical Research Nurse [facility] Direct Dial: [phone] 01/12/2024  11:09 AM

## 2024-01-12 NOTE — Research (Signed)
 [id number]- Effectiveness of Out-of Pocket Cost Communication and Financial Navigation (CostCOM) in Cancer Patients:    Patient Breanna Powers was identified by Dr. Al Pimple as a potential candidate for the above listed study.  This Clinical Research Coordinator met with [name], [id number] on 01/12/24 in a manner and location that ensures patient privacy to discuss participation in the above listed research study.  Patient is Accompanied by husband .  Patient was previously provided with informed consent documents.  Patient confirmed they have read the informed consent documents.  As outlined in the informed consent form, this Coordinator and [name] discussed the purpose of the research study, the investigational nature of the study, study procedures and requirements for study participation, potential risks and benefits of study participation, as well as alternatives to participation.  This study is not blinded or double-blinded. The patient understands participation is voluntary and they may withdraw from study participation at any time.  Each study arm was reviewed, and randomization discussed.  This study does not involve an investigational drug or device. This study does not involve a placebo. Patient understands enrollment is pending full eligibility review.   Confidentiality and how the patient's information will be used as part of study participation were discussed.  Patient was informed there is reimbursement provided for their time and effort spent on trial participation.    All questions were answered to patient's satisfaction.  The informed consent and separate HIPAA Authorization was reviewed page by page.  The patient's mental and emotional status is appropriate to provide informed consent, and the patient verbalizes an understanding of study participation.  Patient has agreed to participate in the above listed research study and has voluntarily signed the informed consent  protocol version date: 07/18/23  and separate HIPAA Authorization, version    [place] IRB Approved Date: 07/24/2023 on 01/12/24 at 10:06 AM.  The patient was provided with a copy of the signed informed consent form and separate HIPAA Authorization for their reference.  No study specific procedures were obtained prior to the signing of the informed consent document.  Approximately 20 minutes were spent with the patient reviewing the informed consent documents.  Patient was not requested to complete a Release of Information form. Patient agreed to take part in the optional interview study and to be contacted for future research.  Patient opted to do questionnaires by email. Patient understands she will receive Baseline questionnaires by email, randomized to study arm, and she will be contacted about which ARM she has been randomized to after she starts chemo. Patient prefers [employer] gift card via mail delivery. The patient was given contact information, encouraged to call if she has any questions.  This Coordinator has reviewed this patient's inclusion and exclusion criteria and confirmed [name] is eligible for study participation.  Patient will continue with enrollment.   Eligibility confirmed by treating investigator, who also agrees that patient should proceed with enrollment.   Cooper Render, MPH  Clinical Research Coordinator

## 2024-01-12 NOTE — Telephone Encounter (Signed)
 Pt called and LVM  stating she no longer wants to try dignicap. She would like to know how to go about cancelling that and how to get her money back. Message sent to scheduling to edit appts accordingly.

## 2024-01-15 NOTE — Assessment & Plan Note (Signed)
 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  Breast cancer Undergoing chemotherapy every 21 days for four cycles. Opted against cold cap due to potential hair loss. Prepared for hair loss and plans to use wigs. Experiencing reactive leukocytosis due to dexamethasone, which is normal. Emphasized hydration for vein access and infection monitoring during chemotherapy. - Administer chemotherapy infusion today. - Ensure adequate hydration for vein access. - Monitor for infection during chemotherapy. - Schedule next chemotherapy cycle in 21 days.  Reactive leukocytosis Slightly elevated white blood cell count due to dexamethasone, a normal reactive process.  Medication interaction concern Potential interaction between carbidopa/levodopa and Compazine. Takes both immediate-release and controlled-release forms. Pharmacist consultation needed for timing. - ok to use compazine PRN, prefer ondansetron as her primary medication for nausea  Follow-up Follow-up appointment scheduled for March 27th before the next chemotherapy cycle. - Ensure attendance at follow-up appointment on March 27th. - Instruct to call the clinic if any issues arise.

## 2024-01-15 NOTE — Progress Notes (Unsigned)
 [facility] CONSULT NOTE  Patient Care Team: Jarrett Soho, PA-C as PCP - General (Family Medicine) Pershing Proud, RN as Oncology Nurse Navigator Donnelly Angelica, RN as Oncology Nurse Navigator Rachel Moulds, MD as Consulting Physician (Hematology and Oncology)  CHIEF COMPLAINTS/PURPOSE OF CONSULTATION:  Newly diagnosed breast cancer  HISTORY OF PRESENTING ILLNESS:  Breanna Powers 68 y.o. female is here because of recent diagnosis of left breast cancer.  I reviewed her records extensively and collaborated the history with the patient.  SUMMARY OF ONCOLOGIC HISTORY: Oncology History  Malignant neoplasm of upper-inner quadrant of left breast in female, estrogen receptor positive (HCC)  10/09/2023 Mammogram   Mammogram showed possible asymmetry in the right breast and left breast hence diagnostic mammogram and ultrasound were recommended.  Diagnostic mammogram once again confirmed a 5 x 4 x 4 mm irregular hypoechoic mass left breast at 9 o'clock position 6 cm from the nipple.  No left axillary adenopathy.  Questioned asymmetry in the right breast resolved with additional imaging compatible with dense overlapping fibroglandular tissue.   11/14/2023 Pathology Results   Left breast needle core biopsy at 9:00 showed invasive ductal carcinoma, overall grade 2, prognostics showed ER 95% positive strong staining PR 60% positive weak to moderate staining, Ki-67 of 10% and HER2 1+.   11/20/2023 Initial Diagnosis   Malignant neoplasm of upper-inner quadrant of left breast in female, estrogen receptor positive (HCC)   11/22/2023 Cancer Staging   Staging form: Breast, AJCC 8th Edition - Pathologic stage from 11/22/2023: Stage IA (pT1a, pN0, cM0, G2, ER+, PR+, HER2-) - Signed by Rachel Moulds, MD on 11/22/2023 Stage prefix: Initial diagnosis Histologic grading system: 3 grade system Laterality: Left Stage used in treatment planning: Yes National guidelines used in  treatment planning: Yes Type of national guideline used in treatment planning: NCCN   11/30/2023 Genetic Testing   Negative [facility] CancerNext+RNAinsight Panel.  Report date is 11/30/2023.  The [employer] CancerNext+RNAinsight Panel includes sequencing, rearrangement analysis, and RNA analysis for the following 39 genes: APC, ATM, BAP1, BARD1, BMPR1A, BRCA1, BRCA2, BRIP1, CDH1, CDKN2A, CHEK2, FH, FLCN, MET, MLH1, MSH2, MSH6, MUTYH, NF1, NTHL1, PALB2, PMS2, PTEN, RAD51C, RAD51D, SMAD4, STK11, TP53, TSC1, TSC2, and VHL (sequencing and deletion/duplication); AXIN2, HOXB13, MBD4, MSH3, POLD1 and POLE (sequencing only); EPCAM and GREM1 (deletion/duplication only).    12/07/2023 Pathology Results   Grade 2 IDC, Post margin pos, ER: 95%, positive, strong staining intensity  PR: 60%, positive, weak-moderate intensity  Her2: 1+, negative  Ki-67: 10%  LN negative   12/20/2023 Oncotype testing   Oncotype DX of 27, distant recurrence risk at 9 yrs of 16%, group average absolute chemo benefit greater than 15%   01/17/2024 -  Chemotherapy   Patient is on Treatment Plan : BREAST TC q21d      Patient arrived to the appointment today with her husband. Discussed the use of AI scribe software for clinical note transcription with the patient, who gave verbal consent to proceed.  History of Present Illness       MEDICAL HISTORY:  Past Medical History:  Diagnosis Date   Dermatitis    Eczema    Fibroid    Hemiparesis (HCC)    right side "part of the parkinson's" also had a muscle tear repair in R shoulder   Hypertension    Knee pain    Menopause    Parkinson disease (HCC)     SURGICAL HISTORY: Past Surgical History:  Procedure Laterality Date   BREAST BIOPSY  Left 11/14/2023   [place] LT BREAST BX W LOC DEV 1ST LESION IMG BX SPEC US GUIDE 11/14/2023 [facility]   BREAST BIOPSY Left 12/06/2023   [place] LT RADIOACTIVE SEED LOC 12/06/2023 [facility]MO[facility]   BREAST LUMPECTOMY WITH RADIOACTIVE SEED AND SENTINEL  LYMPH NODE BIOPSY Left 12/07/2023   Procedure: LEFT SEED LUMPECTOMY AND SENTINEL LYMPH NODE BIOPSY;  Surgeon: Almond Lint, MD;  Location: [facility];  Service: General;  Laterality: Left;  GEN w/REGIONAL   COLONOSCOPY     2021   CYSTOSCOPY     10/2019   SHOULDER ARTHROSCOPY Right    2018 W biceps tenodesis   TOTAL ABDOMINAL HYSTERECTOMY     10-29-2019   TRIGGER FINGER RELEASE Left    09/29/2020   tummy tuck     04-2023   WISDOM TOOTH EXTRACTION      SOCIAL HISTORY: Social History   Socioeconomic History   Marital status: Married    Spouse name: Not on file   Number of children: 0   Years of education: Assoc   Highest education level: Not on file  Occupational History   Occupation: [employer]  Tobacco Use   Smoking status: Never   Smokeless tobacco: Never  Vaping Use   Vaping status: Never Used  Substance and Sexual Activity   Alcohol use: Yes   Drug use: Never   Sexual activity: Not on file    Comment: TAH  Other Topics Concern   Not on file  Social History Narrative   Patient drinks 3-4 cups of coffee a day    Associates degree   Right handed   Goes to the gym for exercise 3-4 times per week   Social Drivers of Health   Financial Resource Strain: Low Risk  (03/08/2023)   Received from [facility], [facility]   Overall Financial Resource Strain (CARDIA)    Difficulty of Paying Living Expenses: Not hard at all  Food Insecurity: No Food Insecurity (11/22/2023)   Hunger Vital Sign    Worried About Running Out of Food in the Last Year: Never true    Ran Out of Food in the Last Year: Never true  Transportation Needs: No Transportation Needs (11/22/2023)   PRAPARE - Administrator, Civil Service (Medical): No    Lack of Transportation (Non-Medical): No  Physical Activity: Insufficiently Active (08/10/2021)   Received from [facility] visits prior to 12/24/2022., [facility] visits prior to  12/24/2022.   Exercise Vital Sign    Days of Exercise per Week: 1 day    Minutes of Exercise per Session: 60 min  Stress: Stress Concern Present (08/10/2021)   Received from [facility] visits prior to 12/24/2022., [facility] visits prior to 12/24/2022.   [employer] of Occupational Health - Occupational Stress Questionnaire    Feeling of Stress : Rather much  Social Connections: Unknown (02/07/2023)   Received from [facility], [facility]   Social Network    Social Network: Not on file  Intimate Partner Violence: Not At Risk (11/22/2023)   Humiliation, Afraid, Rape, and Kick questionnaire    Fear of Current or Ex-Partner: No    Emotionally Abused: No    Physically Abused: No    Sexually Abused: No    FAMILY HISTORY: Family History  Problem Relation Age of Onset   Neuropathy Mother    Dementia Mother    Depression Mother  Hypertension Mother    Restless legs syndrome Mother    Parkinson's disease Father    Heart disease Father    Breast cancer Sister 66       d. 37   Atrial fibrillation Sister    Osteoporosis Sister    Heart disease Sister    Cancer Maternal Aunt        unk type; ? pancreatic; mets dx >50   Breast cancer Paternal Aunt        dx >50   Breast cancer Paternal Grandmother        mets; d. 78s   Heart disease Paternal Grandfather     ALLERGIES:  has no known allergies.  MEDICATIONS:  Current Outpatient Medications  Medication Sig Dispense Refill   amantadine (SYMMETREL) 100 MG capsule Take 1 capsule (100 mg total) by mouth 2 (two) times daily. 60 capsule 3   B Complex Vitamins (VITAMIN B COMPLEX) CAPS Take 1 capsule by mouth daily at 6 (six) AM. (Patient not taking: Reported on 01/01/2024)     Calcium Carb-Cholecalciferol (CALCIUM 600/VITAMIN D3) 600-20 MG-MCG TABS Take by mouth 2 (two) times daily.     carbidopa-levodopa (SINEMET CR) 50-200 MG tablet Take 1 tablet by mouth at bedtime. 30 tablet 5    carbidopa-levodopa (SINEMET IR) 25-100 MG tablet TAKE 1 TABLET FOUR TIMES A DAY 360 tablet 3   Cholecalciferol (VITAMIN D3) 1000 units CAPS Take 2,000 Units by mouth.     dexamethasone (DECADRON) 4 MG tablet Take 2 tabs by mouth 2 times daily starting day before chemo. Then take 2 tabs daily for 2 days starting day after chemo. Take with food. 30 tablet 1   lidocaine-prilocaine (EMLA) cream Apply to affected area once 30 g 3   losartan (COZAAR) 25 MG tablet Take 25 mg by mouth daily.     Lutein 20 MG TABS Take 1 tablet by mouth daily after supper. (Patient not taking: Reported on 01/01/2024)     Multiple Vitamin (MULTIVITAMIN) tablet Take by mouth.     ondansetron (ZOFRAN) 8 MG tablet Take 1 tablet (8 mg total) by mouth every 8 (eight) hours as needed for nausea or vomiting. Start on the third day after chemotherapy. 30 tablet 1   oxyCODONE (OXY IR/ROXICODONE) 5 MG immediate release tablet Take 0.5-1 tablets (2.5-5 mg total) by mouth every 6 (six) hours as needed for severe pain (pain score 7-10). (Patient not taking: Reported on 01/01/2024) 8 tablet 0   pramipexole (MIRAPEX) 0.5 MG tablet TAKE 1 TABLET 3 TO 4 TIMES DAILY 360 tablet 3   prochlorperazine (COMPAZINE) 10 MG tablet Take 1 tablet (10 mg total) by mouth every 6 (six) hours as needed for nausea or vomiting. 30 tablet 1   vitamin B-12 (CYANOCOBALAMIN) 500 MCG tablet Take 500 mcg by mouth daily.     Vitamin E 180 MG (400 UNIT) CAPS Take 180 mg by mouth. (Patient not taking: Reported on 01/01/2024)     No current facility-administered medications for this visit.    REVIEW OF SYSTEMS:   Constitutional: Denies fevers, chills or abnormal night sweats Eyes: Denies blurriness of vision, double vision or watery eyes Ears, nose, mouth, throat, and face: Denies mucositis or sore throat Respiratory: Denies cough, dyspnea or wheezes Cardiovascular: Denies palpitation, chest discomfort or lower extremity swelling Gastrointestinal:  Denies nausea,  heartburn or change in bowel habits Skin: Denies abnormal skin rashes Lymphatics: Denies new lymphadenopathy or easy bruising Neurological:Denies numbness, tingling or new weaknesses Behavioral/Psych: Mood is stable,  no new changes  Breast: Denies any palpable lumps or discharge All other systems were reviewed with the patient and are negative.  PHYSICAL EXAMINATION: ECOG PERFORMANCE STATUS: 0 - Asymptomatic  There were no vitals filed for this visit.  There were no vitals filed for this visit.   GENERAL:alert, no distress and comfortable  LABORATORY DATA:  I have reviewed the data as listed Lab Results  Component Value Date   WBC 6.4 11/22/2023   HGB 13.2 11/22/2023   HCT 39.1 11/22/2023   MCV 95.6 11/22/2023   PLT 247 11/22/2023   Lab Results  Component Value Date   NA 139 11/22/2023   K 4.5 11/22/2023   CL 106 11/22/2023   CO2 29 11/22/2023    RADIOGRAPHIC STUDIES: I have personally reviewed the radiological reports and agreed with the findings in the report.  ASSESSMENT AND PLAN:   No problem-specific Assessment & Plan notes found for this encounter.       Rachel Moulds, MD 01/15/24

## 2024-01-16 ENCOUNTER — Inpatient Hospital Stay (HOSPITAL_BASED_OUTPATIENT_CLINIC_OR_DEPARTMENT_OTHER): Admitting: Hematology and Oncology

## 2024-01-16 ENCOUNTER — Inpatient Hospital Stay

## 2024-01-16 ENCOUNTER — Other Ambulatory Visit: Payer: Self-pay | Admitting: Pharmacist

## 2024-01-16 VITALS — BP 139/77 | HR 80 | Temp 98.1°F | Resp 17 | Wt 129.2 lb

## 2024-01-16 VITALS — BP 127/77 | HR 72 | Temp 98.0°F | Resp 18

## 2024-01-16 DIAGNOSIS — C50212 Malignant neoplasm of upper-inner quadrant of left female breast: Secondary | ICD-10-CM

## 2024-01-16 DIAGNOSIS — Z79899 Other long term (current) drug therapy: Secondary | ICD-10-CM | POA: Diagnosis not present

## 2024-01-16 DIAGNOSIS — Z5189 Encounter for other specified aftercare: Secondary | ICD-10-CM | POA: Diagnosis not present

## 2024-01-16 DIAGNOSIS — Z170 Estrogen receptor positive status [ER+]: Secondary | ICD-10-CM | POA: Diagnosis not present

## 2024-01-16 DIAGNOSIS — Z5111 Encounter for antineoplastic chemotherapy: Secondary | ICD-10-CM | POA: Diagnosis not present

## 2024-01-16 DIAGNOSIS — G20A1 Parkinson's disease without dyskinesia, without mention of fluctuations: Secondary | ICD-10-CM | POA: Diagnosis not present

## 2024-01-16 DIAGNOSIS — Z1721 Progesterone receptor positive status: Secondary | ICD-10-CM | POA: Diagnosis not present

## 2024-01-16 DIAGNOSIS — Z1732 Human epidermal growth factor receptor 2 negative status: Secondary | ICD-10-CM | POA: Diagnosis not present

## 2024-01-16 LAB — CBC WITH DIFFERENTIAL (CANCER CENTER ONLY)
Abs Immature Granulocytes: 0.03 10*3/uL (ref 0.00–0.07)
Basophils Absolute: 0 10*3/uL (ref 0.0–0.1)
Basophils Relative: 0 %
Eosinophils Absolute: 0 10*3/uL (ref 0.0–0.5)
Eosinophils Relative: 0 %
HCT: 40.9 % (ref 36.0–46.0)
Hemoglobin: 14.1 g/dL (ref 12.0–15.0)
Immature Granulocytes: 0 %
Lymphocytes Relative: 7 %
Lymphs Abs: 0.8 10*3/uL (ref 0.7–4.0)
MCH: 31.8 pg (ref 26.0–34.0)
MCHC: 34.5 g/dL (ref 30.0–36.0)
MCV: 92.1 fL (ref 80.0–100.0)
Monocytes Absolute: 0.3 10*3/uL (ref 0.1–1.0)
Monocytes Relative: 3 %
Neutro Abs: 10.1 10*3/uL — ABNORMAL HIGH (ref 1.7–7.7)
Neutrophils Relative %: 90 %
Platelet Count: 215 10*3/uL (ref 150–400)
RBC: 4.44 MIL/uL (ref 3.87–5.11)
RDW: 11.2 % — ABNORMAL LOW (ref 11.5–15.5)
WBC Count: 11.2 10*3/uL — ABNORMAL HIGH (ref 4.0–10.5)
nRBC: 0 % (ref 0.0–0.2)

## 2024-01-16 LAB — CMP (CANCER CENTER ONLY)
ALT: 6 U/L (ref 0–44)
AST: 14 U/L — ABNORMAL LOW (ref 15–41)
Albumin: 4.4 g/dL (ref 3.5–5.0)
Alkaline Phosphatase: 113 U/L (ref 38–126)
Anion gap: 8 (ref 5–15)
BUN: 21 mg/dL (ref 8–23)
CO2: 26 mmol/L (ref 22–32)
Calcium: 10.1 mg/dL (ref 8.9–10.3)
Chloride: 106 mmol/L (ref 98–111)
Creatinine: 0.9 mg/dL (ref 0.44–1.00)
GFR, Estimated: >60 mL/min (ref >60)
Glucose, Bld: 139 mg/dL — ABNORMAL HIGH (ref 70–99)
Potassium: 3.9 mmol/L (ref 3.5–5.1)
Sodium: 140 mmol/L (ref 135–145)
Total Bilirubin: 0.6 mg/dL (ref 0.0–1.2)
Total Protein: 7.3 g/dL (ref 6.5–8.1)

## 2024-01-16 MED ORDER — SODIUM CHLORIDE 0.9% FLUSH: 10.0000 mL | INTRAVENOUS | Status: DC | PRN

## 2024-01-16 MED ORDER — SODIUM CHLORIDE 0.9 % IV SOLN
75.0000 mg/m2 | Freq: Once | INTRAVENOUS | Status: AC
  Administered 2024-01-16: 120 mg via INTRAVENOUS
  Filled 2024-01-16: qty 12

## 2024-01-16 MED ORDER — HEPARIN SOD (PORK) LOCK FLUSH 100 UNIT/ML IV SOLN: 500.0000 [IU] | Freq: Once | INTRAVENOUS | Status: DC | PRN

## 2024-01-16 MED ORDER — SODIUM CHLORIDE 0.9 % IV SOLN: INTRAVENOUS | Status: DC

## 2024-01-16 MED ORDER — PALONOSETRON HCL INJECTION 0.25 MG/5ML
0.2500 mg | Freq: Once | INTRAVENOUS | Status: AC
  Administered 2024-01-16: 0.25 mg via INTRAVENOUS
  Filled 2024-01-16: qty 5

## 2024-01-16 MED ORDER — DEXAMETHASONE SODIUM PHOSPHATE 10 MG/ML IJ SOLN
10.0000 mg | Freq: Once | INTRAMUSCULAR | Status: AC
Start: 2024-01-16 — End: 2024-01-16
  Administered 2024-01-16: 10 mg via INTRAVENOUS
  Filled 2024-01-16: qty 1

## 2024-01-16 MED ORDER — SODIUM CHLORIDE 0.9 % IV SOLN
600.0000 mg/m2 | Freq: Once | INTRAVENOUS | Status: AC
  Administered 2024-01-16: 1000 mg via INTRAVENOUS
  Filled 2024-01-16: qty 50

## 2024-01-16 NOTE — Research (Unsigned)
 [id number]- Effectiveness of Out-of Pocket Cost Communication and Financial Navigation (CostCOM) in Cancer Patients:   This Nurse has reviewed this patient's inclusion and exclusion criteria as a second review and confirms Breanna Powers is eligible for study participation.  Patient may continue with enrollment.  Domenica Reamer, BSN, RN, [employer] Research Nurse II [phone] 01/16/2024

## 2024-01-16 NOTE — Patient Instructions (Signed)
 [facility] - A DEPT OF [facility]  Discharge Instructions: Thank you for choosing [facility] to provide your oncology and hematology care.   If you have a lab appointment with the Cancer Center, please go directly to the Cancer Center and check in at the registration area.   Wear comfortable clothing and clothing appropriate for easy access to any Portacath or PICC line.   We strive to give you quality time with your provider. You may need to reschedule your appointment if you arrive late (15 or more minutes).  Arriving late affects you and other patients whose appointments are after yours.  Also, if you miss three or more appointments without notifying the office, you may be dismissed from the clinic at the provider's discretion.      For prescription refill requests, have your pharmacy contact our office and allow 72 hours for refills to be completed.    Today you received the following chemotherapy and/or immunotherapy agents: Docetaxel, Cytoxan.       To help prevent nausea and vomiting after your treatment, we encourage you to take your nausea medication as directed.  BELOW ARE SYMPTOMS THAT SHOULD BE REPORTED IMMEDIATELY: *FEVER GREATER THAN 100.4 F (38 C) OR HIGHER *CHILLS OR SWEATING *NAUSEA AND VOMITING THAT IS NOT CONTROLLED WITH YOUR NAUSEA MEDICATION *UNUSUAL SHORTNESS OF BREATH *UNUSUAL BRUISING OR BLEEDING *URINARY PROBLEMS (pain or burning when urinating, or frequent urination) *BOWEL PROBLEMS (unusual diarrhea, constipation, pain near the anus) TENDERNESS IN MOUTH AND THROAT WITH OR WITHOUT PRESENCE OF ULCERS (sore throat, sores in mouth, or a toothache) UNUSUAL RASH, SWELLING OR PAIN  UNUSUAL VAGINAL DISCHARGE OR ITCHING   Items with * indicate a potential emergency and should be followed up as soon as possible or go to the Emergency Department if any problems should occur.  Please show the CHEMOTHERAPY ALERT CARD or  IMMUNOTHERAPY ALERT CARD at check-in to the Emergency Department and triage nurse.  Should you have questions after your visit or need to cancel or reschedule your appointment, please contact [facility] - A DEPT OF [facility]  Dept: [phone]  and follow the prompts.  Office hours are 8:00 a.m. to 4:30 p.m. Monday - Friday. Please note that voicemails left after 4:00 p.m. may not be returned until the following business day.  We are closed weekends and major holidays. You have access to a nurse at all times for urgent questions. Please call the main number to the clinic Dept: [phone] and follow the prompts.   For any non-urgent questions, you may also contact your provider using MyChart. We now offer e-Visits for anyone 72 and older to request care online for non-urgent symptoms. For details visit [email or web address].   Also download the MyChart app! Go to the app store, search "MyChart", open the app, select [facility], and log in with your MyChart username and password.  Docetaxel Injection What is this medication? DOCETAXEL (doe se TAX el) treats some types of cancer. It works by slowing down the growth of cancer cells. This medicine may be used for other purposes; ask your health care provider or pharmacist if you have questions. COMMON BRAND NAME(S): BEIZRAY, Docefrez, Docivyx, Taxotere What should I tell my care team before I take this medication? They need to know if you have any of these conditions: Kidney disease Liver disease Low white blood cell levels Tingling of the fingers or toes  or other nerve disorder An unusual or allergic reaction to docetaxel, polysorbate 80, other medications, foods, dyes, or preservatives Pregnant or trying to get pregnant Breast-feeding How should I use this medication? This medication is injected into a vein. It is given by your care team in a hospital or clinic setting. Talk to your care team about the  use of this medication in children. Special care may be needed. Overdosage: If you think you have taken too much of this medicine contact a poison control center or emergency room at once. NOTE: This medicine is only for you. Do not share this medicine with others. What if I miss a dose? Keep appointments for follow-up doses. It is important not to miss your dose. Call your care team if you are unable to keep an appointment. What may interact with this medication? Do not take this medication with any of the following: Live virus vaccines This medication may also interact with the following: Certain antibiotics, such as clarithromycin, telithromycin Certain antivirals for HIV or hepatitis Certain medications for fungal infections, such as itraconazole, ketoconazole, voriconazole Grapefruit juice Nefazodone Supplements, such as St. John's wort This list may not describe all possible interactions. Give your health care provider a list of all the medicines, herbs, non-prescription drugs, or dietary supplements you use. Also tell them if you smoke, drink alcohol, or use illegal drugs. Some items may interact with your medicine. What should I watch for while using this medication? This medication may make you feel generally unwell. This is not uncommon as chemotherapy can affect healthy cells as well as cancer cells. Report any side effects. Continue your course of treatment even though you feel ill unless your care team tells you to stop. You may need blood work done while you are taking this medication. This medication can cause serious side effects and infusion reactions. To reduce the risk, your care team may give you other medications to take before receiving this one. Be sure to follow the directions from your care team. This medication may increase your risk of getting an infection. Call your care team for advice if you get a fever, chills, sore throat, or other symptoms of a cold or flu. Do not  treat yourself. Try to avoid being around people who are sick. Avoid taking medications that contain aspirin, acetaminophen, ibuprofen, naproxen, or ketoprofen unless instructed by your care team. These medications may hide a fever. Be careful brushing or flossing your teeth or using a toothpick because you may get an infection or bleed more easily. If you have any dental work done, tell your dentist you are receiving this medication. Some products may contain alcohol. Ask your care team if this medication contains alcohol. Be sure to tell all care teams you are taking this medicine. Certain medications, like metronidazole and disulfiram, can cause an unpleasant reaction when taken with alcohol. The reaction includes flushing, headache, nausea, vomiting, sweating, and increased thirst. The reaction can last from 30 minutes to several hours. This medication may affect your coordination, reaction time, or judgement. Do not drive or operate machinery until you know how this medication affects you. Sit up or stand slowly to reduce the risk of dizzy or fainting spells. Drinking alcohol with this medication can increase the risk of these side effects. Talk to your care team about your risk of cancer. You may be more at risk for certain types of cancer if you take this medication. Talk to your care team if you wish to become  pregnant or think you might be pregnant. This medication can cause serious birth defects if taken during pregnancy or if you get pregnant within 2 months after stopping therapy. A negative pregnancy test is required before starting this medication. A reliable form of contraception is recommended while taking this medication and for 2 months after stopping it. Talk to your care team about reliable forms of contraception. Do not breast-feed while taking this medication and for 1 week after stopping therapy. Use a condom during sex and for 4 months after stopping therapy. Tell your care team right  away if you think your partner might be pregnant. This medication can cause serious birth defects. This medication may cause infertility. Talk to your care team if you are concerned about your fertility. What side effects may I notice from receiving this medication? Side effects that you should report to your care team as soon as possible: Allergic reactions--skin rash, itching, hives, swelling of the face, lips, tongue, or throat Change in vision such as blurry vision, seeing halos around lights, vision loss Infection--fever, chills, cough, or sore throat Infusion reactions--chest pain, shortness of breath or trouble breathing, feeling faint or lightheaded Low red blood cell level--unusual weakness or fatigue, dizziness, headache, trouble breathing Pain, tingling, or numbness in the hands or feet Painful swelling, warmth, or redness of the skin, blisters or sores at the infusion site Redness, blistering, peeling, or loosening of the skin, including inside the mouth Sudden or severe stomach pain, bloody diarrhea, fever, nausea, vomiting Swelling of the ankles, hands, or feet Tumor lysis syndrome (TLS)--nausea, vomiting, diarrhea, decrease in the amount of urine, dark urine, unusual weakness or fatigue, confusion, muscle pain or cramps, fast or irregular heartbeat, joint pain Unusual bruising or bleeding Side effects that usually do not require medical attention (report to your care team if they continue or are bothersome): Change in nail shape, thickness, or color Change in taste Hair loss Increased tears This list may not describe all possible side effects. Call your doctor for medical advice about side effects. You may report side effects to FDA at 1-800-FDA-1088. Where should I keep my medication? This medication is given in a hospital or clinic. It will not be stored at home. NOTE: This sheet is a summary. It may not cover all possible information. If you have questions about this  medicine, talk to your doctor, pharmacist, or health care provider.  2024 Elsevier/Gold Standard (2021-12-16 00:00:00) Cyclophosphamide Injection What is this medication? CYCLOPHOSPHAMIDE (sye kloe FOSS fa mide) treats some types of cancer. It works by slowing down the growth of cancer cells. This medicine may be used for other purposes; ask your health care provider or pharmacist if you have questions. COMMON BRAND NAME(S): Cyclophosphamide, Cytoxan, Neosar What should I tell my care team before I take this medication? They need to know if you have any of these conditions: Heart disease Irregular heartbeat or rhythm Infection Kidney problems Liver disease Low blood cell levels (white cells, platelets, or red blood cells) Lung disease Previous radiation Trouble passing urine An unusual or allergic reaction to cyclophosphamide, other medications, foods, dyes, or preservatives Pregnant or trying to get pregnant Breast-feeding How should I use this medication? This medication is injected into a vein. It is given by your care team in a hospital or clinic setting. Talk to your care team about the use of this medication in children. Special care may be needed. Overdosage: If you think you have taken too much of this medicine contact a  poison control center or emergency room at once. NOTE: This medicine is only for you. Do not share this medicine with others. What if I miss a dose? Keep appointments for follow-up doses. It is important not to miss your dose. Call your care team if you are unable to keep an appointment. What may interact with this medication? Amphotericin B Amiodarone Azathioprine Certain antivirals for HIV or hepatitis Certain medications for blood pressure, such as enalapril, lisinopril, quinapril Cyclosporine Diuretics Etanercept Indomethacin Medications that relax muscles Metronidazole Natalizumab Tamoxifen Warfarin This list may not describe all possible  interactions. Give your health care provider a list of all the medicines, herbs, non-prescription drugs, or dietary supplements you use. Also tell them if you smoke, drink alcohol, or use illegal drugs. Some items may interact with your medicine. What should I watch for while using this medication? This medication may make you feel generally unwell. This is not uncommon as chemotherapy can affect healthy cells as well as cancer cells. Report any side effects. Continue your course of treatment even though you feel ill unless your care team tells you to stop. You may need blood work while you are taking this medication. This medication may increase your risk of getting an infection. Call your care team for advice if you get a fever, chills, sore throat, or other symptoms of a cold or flu. Do not treat yourself. Try to avoid being around people who are sick. Avoid taking medications that contain aspirin, acetaminophen, ibuprofen, naproxen, or ketoprofen unless instructed by your care team. These medications may hide a fever. Be careful brushing or flossing your teeth or using a toothpick because you may get an infection or bleed more easily. If you have any dental work done, tell your dentist you are receiving this medication. Drink water or other fluids as directed. Urinate often, even at night. Some products may contain alcohol. Ask your care team if this medication contains alcohol. Be sure to tell all care teams you are taking this medicine. Certain medicines, like metronidazole and disulfiram, can cause an unpleasant reaction when taken with alcohol. The reaction includes flushing, headache, nausea, vomiting, sweating, and increased thirst. The reaction can last from 30 minutes to several hours. Talk to your care team if you wish to become pregnant or think you might be pregnant. This medication can cause serious birth defects if taken during pregnancy and for 1 year after the last dose. A negative  pregnancy test is required before starting this medication. A reliable form of contraception is recommended while taking this medication and for 1 year after the last dose. Talk to your care team about reliable forms of contraception. Do not father a child while taking this medication and for 4 months after the last dose. Use a condom during this time period. Do not breast-feed while taking this medication or for 1 week after the last dose. This medication may cause infertility. Talk to your care team if you are concerned about your fertility. Talk to your care team about your risk of cancer. You may be more at risk for certain types of cancer if you take this medication. What side effects may I notice from receiving this medication? Side effects that you should report to your care team as soon as possible: Allergic reactions--skin rash, itching, hives, swelling of the face, lips, tongue, or throat Dry cough, shortness of breath or trouble breathing Heart failure--shortness of breath, swelling of the ankles, feet, or hands, sudden weight gain, unusual weakness  or fatigue Heart muscle inflammation--unusual weakness or fatigue, shortness of breath, chest pain, fast or irregular heartbeat, dizziness, swelling of the ankles, feet, or hands Heart rhythm changes--fast or irregular heartbeat, dizziness, feeling faint or lightheaded, chest pain, trouble breathing Infection--fever, chills, cough, sore throat, wounds that don't heal, pain or trouble when passing urine, general feeling of discomfort or being unwell Kidney injury--decrease in the amount of urine, swelling of the ankles, hands, or feet Liver injury--right upper belly pain, loss of appetite, nausea, light-colored stool, dark yellow or brown urine, yellowing skin or eyes, unusual weakness or fatigue Low red blood cell level--unusual weakness or fatigue, dizziness, headache, trouble breathing Low sodium level--muscle weakness, fatigue, dizziness,  headache, confusion Red or dark brown urine Unusual bruising or bleeding Side effects that usually do not require medical attention (report to your care team if they continue or are bothersome): Hair loss Irregular menstrual cycles or spotting Loss of appetite Nausea Pain, redness, or swelling with sores inside the mouth or throat Vomiting This list may not describe all possible side effects. Call your doctor for medical advice about side effects. You may report side effects to FDA at 1-800-FDA-1088. Where should I keep my medication? This medication is given in a hospital or clinic. It will not be stored at home. NOTE: This sheet is a summary. It may not cover all possible information. If you have questions about this medicine, talk to your doctor, pharmacist, or health care provider.  2024 Elsevier/Gold Standard (2022-02-25 00:00:00)

## 2024-01-16 NOTE — Research (Unsigned)
 [id number]- Effectiveness of Out-of Pocket Cost Communication and Financial Navigation (CostCOM) in Cancer Patients:    Step 1 Registration Randomization This Coordinator has reviewed this patient's inclusion and exclusion criteria and confirmed Breanna Powers is eligible for study participation.  Patient will continue with enrollment.   Eligibility confirmed by treating investigator, who also agrees that patient should proceed with enrollment.   Patient was registered to Step 1 and Randomized to ARM B.

## 2024-01-17 ENCOUNTER — Other Ambulatory Visit

## 2024-01-17 ENCOUNTER — Encounter: Payer: Self-pay | Admitting: Hematology and Oncology

## 2024-01-17 ENCOUNTER — Telehealth: Payer: Self-pay

## 2024-01-17 ENCOUNTER — Ambulatory Visit: Admitting: Hematology and Oncology

## 2024-01-17 ENCOUNTER — Ambulatory Visit

## 2024-01-17 DIAGNOSIS — C50212 Malignant neoplasm of upper-inner quadrant of left female breast: Secondary | ICD-10-CM

## 2024-01-17 NOTE — Telephone Encounter (Signed)
 Breanna Powers states that she is doing fine. She is eating, drinking, and urinating well. She knows to call the office at [phone] if  she has any questions or concerns.

## 2024-01-17 NOTE — Telephone Encounter (Signed)
 [id number]- Effectiveness of Out-of Pocket Cost Communication and Financial Navigation (CostCOM) in Cancer Patients:     Discussed with patient via telephone that she was randomized to ARM B and scheduled [facility] 1hr initial phone appointment for patient via link provided by the study. Scheduled appt for Thurs Apr 3rd 10-11am. Provided patient with appointment details and phone number [facility] Footman for [facility] [phone]; [email or web address]. Pt is aware [facility] will be contacting her via the number provided on scheduled day/time. Patient did not have any questions and thanked pt for her participation in this financial navigation study. Confirmed patient has my contact information if she needs to reach me for anything.  Cooper Render, MPH  Clinical Research Coordinator

## 2024-01-17 NOTE — Telephone Encounter (Signed)
-----   Message from Nurse Threasa Beards sent at 01/16/2024  1:42 PM EDT ----- Regarding: Dr Al Pimple pt, first time Docetaxel and Cytoxan Dr Al Pimple pt came in 3/25 for first time Docetaxel and Cytoxan. Tolerated infusions well. Needs call back.

## 2024-01-18 ENCOUNTER — Inpatient Hospital Stay

## 2024-01-18 ENCOUNTER — Telehealth: Payer: Self-pay | Admitting: Hematology and Oncology

## 2024-01-18 ENCOUNTER — Encounter: Payer: Self-pay | Admitting: Hematology and Oncology

## 2024-01-18 VITALS — BP 139/87 | HR 84

## 2024-01-18 DIAGNOSIS — Z79899 Other long term (current) drug therapy: Secondary | ICD-10-CM | POA: Diagnosis not present

## 2024-01-18 DIAGNOSIS — Z1732 Human epidermal growth factor receptor 2 negative status: Secondary | ICD-10-CM | POA: Diagnosis not present

## 2024-01-18 DIAGNOSIS — Z5111 Encounter for antineoplastic chemotherapy: Secondary | ICD-10-CM | POA: Diagnosis not present

## 2024-01-18 DIAGNOSIS — Z1721 Progesterone receptor positive status: Secondary | ICD-10-CM | POA: Diagnosis not present

## 2024-01-18 DIAGNOSIS — C50212 Malignant neoplasm of upper-inner quadrant of left female breast: Secondary | ICD-10-CM | POA: Diagnosis not present

## 2024-01-18 DIAGNOSIS — Z5189 Encounter for other specified aftercare: Secondary | ICD-10-CM | POA: Diagnosis not present

## 2024-01-18 DIAGNOSIS — G20A1 Parkinson's disease without dyskinesia, without mention of fluctuations: Secondary | ICD-10-CM | POA: Diagnosis not present

## 2024-01-18 DIAGNOSIS — Z170 Estrogen receptor positive status [ER+]: Secondary | ICD-10-CM | POA: Diagnosis not present

## 2024-01-18 MED ORDER — PEGFILGRASTIM-JMDB 6 MG/0.6ML ~~LOC~~ SOSY
6.0000 mg | PREFILLED_SYRINGE | Freq: Once | SUBCUTANEOUS | Status: AC
  Administered 2024-01-18: 6 mg via SUBCUTANEOUS

## 2024-01-18 NOTE — Telephone Encounter (Signed)
 Left patient a vm regarding upcoming appointment

## 2024-01-19 ENCOUNTER — Ambulatory Visit

## 2024-01-24 ENCOUNTER — Other Ambulatory Visit: Payer: Self-pay | Admitting: *Deleted

## 2024-01-24 ENCOUNTER — Telehealth: Payer: Self-pay

## 2024-01-24 DIAGNOSIS — C50212 Malignant neoplasm of upper-inner quadrant of left female breast: Secondary | ICD-10-CM

## 2024-01-24 NOTE — Telephone Encounter (Signed)
 Spoke with patient and confirmed appointment on 01/25/24

## 2024-01-24 NOTE — Progress Notes (Unsigned)
 Pharmacist Chemotherapy Monitoring - Initial Assessment    Anticipated start date: 01/16/24   The following has been reviewed per standard work regarding the patient's treatment regimen: The patient's diagnosis, treatment plan and drug doses, and organ/hematologic function Lab orders and baseline tests specific to treatment regimen  The treatment plan start date, drug sequencing, and pre-medications Prior authorization status  Patient's documented medication list, including drug-drug interaction screen and prescriptions for anti-emetics and supportive care specific to the treatment regimen The drug concentrations, fluid compatibility, administration routes, and timing of the medications to be used The patient's access for treatment and lifetime cumulative dose history, if applicable  The patient's medication allergies and previous infusion related reactions, if applicable   Changes made to treatment plan:  N/A  Follow up needed:  Pending authorization for treatment   Drusilla Kanner, PharmD, MBA

## 2024-01-24 NOTE — Progress Notes (Unsigned)
 [facility] CONSULT NOTE  Patient Care Team: Jarrett Soho, PA-C as PCP - General (Family Medicine) Pershing Proud, RN as Oncology Nurse Navigator Donnelly Angelica, RN as Oncology Nurse Navigator Rachel Moulds, MD as Consulting Physician (Hematology and Oncology)  CHIEF COMPLAINTS/PURPOSE OF CONSULTATION:  Newly diagnosed breast cancer  HISTORY OF PRESENTING ILLNESS:  Breanna Powers 68 y.o. female is here because of recent diagnosis of left breast cancer.  I reviewed her records extensively and collaborated the history with the patient.  SUMMARY OF ONCOLOGIC HISTORY: Oncology History  Malignant neoplasm of upper-inner quadrant of left breast in female, estrogen receptor positive (HCC)  10/09/2023 Mammogram   Mammogram showed possible asymmetry in the right breast and left breast hence diagnostic mammogram and ultrasound were recommended.  Diagnostic mammogram once again confirmed a 5 x 4 x 4 mm irregular hypoechoic mass left breast at 9 o'clock position 6 cm from the nipple.  No left axillary adenopathy.  Questioned asymmetry in the right breast resolved with additional imaging compatible with dense overlapping fibroglandular tissue.   11/14/2023 Pathology Results   Left breast needle core biopsy at 9:00 showed invasive ductal carcinoma, overall grade 2, prognostics showed ER 95% positive strong staining PR 60% positive weak to moderate staining, Ki-67 of 10% and HER2 1+.   11/20/2023 Initial Diagnosis   Malignant neoplasm of upper-inner quadrant of left breast in female, estrogen receptor positive (HCC)   11/22/2023 Cancer Staging   Staging form: Breast, AJCC 8th Edition - Pathologic stage from 11/22/2023: Stage IA (pT1a, pN0, cM0, G2, ER+, PR+, HER2-) - Signed by Rachel Moulds, MD on 11/22/2023 Stage prefix: Initial diagnosis Histologic grading system: 3 grade system Laterality: Left Stage used in treatment planning: Yes National guidelines used in  treatment planning: Yes Type of national guideline used in treatment planning: NCCN   11/30/2023 Genetic Testing   Negative [facility] CancerNext+RNAinsight Panel.  Report date is 11/30/2023.  The [facility] CancerNext+RNAinsight Panel includes sequencing, rearrangement analysis, and RNA analysis for the following 39 genes: APC, ATM, BAP1, BARD1, BMPR1A, BRCA1, BRCA2, BRIP1, CDH1, CDKN2A, CHEK2, FH, FLCN, MET, MLH1, MSH2, MSH6, MUTYH, NF1, NTHL1, PALB2, PMS2, PTEN, RAD51C, RAD51D, SMAD4, STK11, TP53, TSC1, TSC2, and VHL (sequencing and deletion/duplication); AXIN2, HOXB13, MBD4, MSH3, POLD1 and POLE (sequencing only); EPCAM and GREM1 (deletion/duplication only).    12/07/2023 Pathology Results   Grade 2 IDC, Post margin pos, ER: 95%, positive, strong staining intensity  PR: 60%, positive, weak-moderate intensity  Her2: 1+, negative  Ki-67: 10%  LN negative   12/20/2023 Oncotype testing   Oncotype DX of 27, distant recurrence risk at 9 yrs of 16%, group average absolute chemo benefit greater than 15%   01/16/2024 -  Chemotherapy   Patient is on Treatment Plan : BREAST TC q21d      Patient arrived to the appointment today with her husband. Discussed the use of AI scribe software for clinical note transcription with the patient, who gave verbal consent to proceed.  History of Present Illness    Breanna Powers is a 68 year old female who is here before planned C2 TC.  No pain during urination and no new symptoms suggestive of infection.   MEDICAL HISTORY:  Past Medical History:  Diagnosis Date   Dermatitis    Eczema    Fibroid    Hemiparesis (HCC)    right side "part of the parkinson's" also had a muscle tear repair in R shoulder   Hypertension    Knee pain  Menopause    Parkinson disease (HCC)     SURGICAL HISTORY: Past Surgical History:  Procedure Laterality Date   BREAST BIOPSY Left 11/14/2023   [place] LT BREAST BX W LOC DEV 1ST LESION IMG BX SPEC US GUIDE 11/14/2023 [facility]    BREAST BIOPSY Left 12/06/2023   [place] LT RADIOACTIVE SEED LOC 12/06/2023 [facility]   BREAST LUMPECTOMY WITH RADIOACTIVE SEED AND SENTINEL LYMPH NODE BIOPSY Left 12/07/2023   Procedure: LEFT SEED LUMPECTOMY AND SENTINEL LYMPH NODE BIOPSY;  Surgeon: Almond Lint, MD;  Location:  SURGERY CENTER;  Service: General;  Laterality: Left;  GEN w/REGIONAL   COLONOSCOPY     2021   CYSTOSCOPY     10/2019   SHOULDER ARTHROSCOPY Right    2018 W biceps tenodesis   TOTAL ABDOMINAL HYSTERECTOMY     10-29-2019   TRIGGER FINGER RELEASE Left    09/29/2020   tummy tuck     04-2023   WISDOM TOOTH EXTRACTION      SOCIAL HISTORY: Social History   Socioeconomic History   Marital status: Married    Spouse name: Not on file   Number of children: 0   Years of education: Assoc   Highest education level: Not on file  Occupational History   Occupation: [employer]  Tobacco Use   Smoking status: Never   Smokeless tobacco: Never  Vaping Use   Vaping status: Never Used  Substance and Sexual Activity   Alcohol use: Yes   Drug use: Never   Sexual activity: Not on file    Comment: TAH  Other Topics Concern   Not on file  Social History Narrative   Patient drinks 3-4 cups of coffee a day    Associates degree   Right handed   Goes to the gym for exercise 3-4 times per week   Social Drivers of Health   Financial Resource Strain: Low Risk  (03/08/2023)   Received from [facility], [facility]   Overall Financial Resource Strain (CARDIA)    Difficulty of Paying Living Expenses: Not hard at all  Food Insecurity: No Food Insecurity (11/22/2023)   Hunger Vital Sign    Worried About Running Out of Food in the Last Year: Never true    Ran Out of Food in the Last Year: Never true  Transportation Needs: No Transportation Needs (11/22/2023)   PRAPARE - Administrator, Civil Service (Medical): No    Lack of Transportation (Non-Medical): No  Physical Activity: Insufficiently Active  (08/10/2021)   Received from [facility] visits prior to 12/24/2022., [facility] visits prior to 12/24/2022.   Exercise Vital Sign    Days of Exercise per Week: 1 day    Minutes of Exercise per Session: 60 min  Stress: Stress Concern Present (08/10/2021)   Received from [facility] visits prior to 12/24/2022., [facility] visits prior to 12/24/2022.   [employer] of Occupational Health - Occupational Stress Questionnaire    Feeling of Stress : Rather much  Social Connections: Unknown (02/07/2023)   Received from [facility], [facility]   Social Network    Social Network: Not on file  Intimate Partner Violence: Not At Risk (11/22/2023)   Humiliation, Afraid, Rape, and Kick questionnaire    Fear of Current or Ex-Partner: No    Emotionally Abused: No    Physically Abused: No    Sexually Abused: No    FAMILY  HISTORY: Family History  Problem Relation Age of Onset   Neuropathy Mother    Dementia Mother    Depression Mother    Hypertension Mother    Restless legs syndrome Mother    Parkinson's disease Father    Heart disease Father    Breast cancer Sister 52       d. 52   Atrial fibrillation Sister    Osteoporosis Sister    Heart disease Sister    Cancer Maternal Aunt        unk type; ? pancreatic; mets dx >50   Breast cancer Paternal Aunt        dx >50   Breast cancer Paternal Grandmother        mets; d. 2s   Heart disease Paternal Grandfather     ALLERGIES:  has no known allergies.  MEDICATIONS:  Current Outpatient Medications  Medication Sig Dispense Refill   amantadine (SYMMETREL) 100 MG capsule Take 1 capsule (100 mg total) by mouth 2 (two) times daily. 60 capsule 3   Calcium Carb-Cholecalciferol (CALCIUM 600/VITAMIN D3) 600-20 MG-MCG TABS Take by mouth 2 (two) times daily.     carbidopa-levodopa (SINEMET CR) 50-200 MG tablet Take 1 tablet by mouth at bedtime. 30 tablet 5    carbidopa-levodopa (SINEMET IR) 25-100 MG tablet TAKE 1 TABLET FOUR TIMES A DAY 360 tablet 3   Cholecalciferol (VITAMIN D3) 1000 units CAPS Take 2,000 Units by mouth.     dexamethasone (DECADRON) 4 MG tablet Take 2 tabs by mouth 2 times daily starting day before chemo. Then take 2 tabs daily for 2 days starting day after chemo. Take with food. 30 tablet 1   lidocaine-prilocaine (EMLA) cream Apply to affected area once 30 g 3   losartan (COZAAR) 25 MG tablet Take 25 mg by mouth daily.     Multiple Vitamin (MULTIVITAMIN) tablet Take by mouth.     ondansetron (ZOFRAN) 8 MG tablet Take 1 tablet (8 mg total) by mouth every 8 (eight) hours as needed for nausea or vomiting. Start on the third day after chemotherapy. 30 tablet 1   pramipexole (MIRAPEX) 0.5 MG tablet TAKE 1 TABLET 3 TO 4 TIMES DAILY 360 tablet 3   prochlorperazine (COMPAZINE) 10 MG tablet Take 1 tablet (10 mg total) by mouth every 6 (six) hours as needed for nausea or vomiting. 30 tablet 1   vitamin B-12 (CYANOCOBALAMIN) 500 MCG tablet Take 500 mcg by mouth daily.     No current facility-administered medications for this visit.    REVIEW OF SYSTEMS:   Constitutional: Denies fevers, chills or abnormal night sweats Eyes: Denies blurriness of vision, double vision or watery eyes Ears, nose, mouth, throat, and face: Denies mucositis or sore throat Respiratory: Denies cough, dyspnea or wheezes Cardiovascular: Denies palpitation, chest discomfort or lower extremity swelling Gastrointestinal:  Denies nausea, heartburn or change in bowel habits Skin: Denies abnormal skin rashes Lymphatics: Denies new lymphadenopathy or easy bruising Neurological:Denies numbness, tingling or new weaknesses Behavioral/Psych: Mood is stable, no new changes  Breast: Denies any palpable lumps or discharge All other systems were reviewed with the patient and are negative.  PHYSICAL EXAMINATION: ECOG PERFORMANCE STATUS: 0 - Asymptomatic  There were no vitals  filed for this visit.   There were no vitals filed for this visit.    GENERAL:alert, no distress and comfortable Left breast healed well. No port CTA bilaterally RRR  Abdomen: soft, non tender, non distended No LE edema.  LABORATORY DATA:  I have reviewed  the data as listed Lab Results  Component Value Date   WBC 11.2 (H) 01/16/2024   HGB 14.1 01/16/2024   HCT 40.9 01/16/2024   MCV 92.1 01/16/2024   PLT 215 01/16/2024   Lab Results  Component Value Date   NA 140 01/16/2024   K 3.9 01/16/2024   CL 106 01/16/2024   CO2 26 01/16/2024    RADIOGRAPHIC STUDIES: I have personally reviewed the radiological reports and agreed with the findings in the report.  ASSESSMENT AND PLAN:   No problem-specific Assessment & Plan notes found for this encounter.        Rachel Moulds, MD 01/24/24

## 2024-01-25 ENCOUNTER — Inpatient Hospital Stay: Attending: Hematology and Oncology | Admitting: Hematology and Oncology

## 2024-01-25 ENCOUNTER — Other Ambulatory Visit: Payer: Self-pay

## 2024-01-25 ENCOUNTER — Inpatient Hospital Stay

## 2024-01-25 VITALS — BP 117/62 | HR 95 | Temp 98.3°F | Resp 16 | Wt 131.2 lb

## 2024-01-25 DIAGNOSIS — C50212 Malignant neoplasm of upper-inner quadrant of left female breast: Secondary | ICD-10-CM | POA: Insufficient documentation

## 2024-01-25 DIAGNOSIS — G20A1 Parkinson's disease without dyskinesia, without mention of fluctuations: Secondary | ICD-10-CM | POA: Insufficient documentation

## 2024-01-25 DIAGNOSIS — L659 Nonscarring hair loss, unspecified: Secondary | ICD-10-CM | POA: Insufficient documentation

## 2024-01-25 DIAGNOSIS — Z170 Estrogen receptor positive status [ER+]: Secondary | ICD-10-CM | POA: Insufficient documentation

## 2024-01-25 DIAGNOSIS — R21 Rash and other nonspecific skin eruption: Secondary | ICD-10-CM | POA: Diagnosis not present

## 2024-01-25 DIAGNOSIS — Z5189 Encounter for other specified aftercare: Secondary | ICD-10-CM | POA: Diagnosis not present

## 2024-01-25 DIAGNOSIS — Z5111 Encounter for antineoplastic chemotherapy: Secondary | ICD-10-CM | POA: Insufficient documentation

## 2024-01-25 LAB — CMP (CANCER CENTER ONLY)
ALT: 9 U/L (ref 0–44)
AST: 18 U/L (ref 15–41)
Albumin: 3.6 g/dL (ref 3.5–5.0)
Alkaline Phosphatase: 143 U/L — ABNORMAL HIGH (ref 38–126)
Anion gap: 5 (ref 5–15)
BUN: 8 mg/dL (ref 8–23)
CO2: 29 mmol/L (ref 22–32)
Calcium: 9.4 mg/dL (ref 8.9–10.3)
Chloride: 106 mmol/L (ref 98–111)
Creatinine: 1.15 mg/dL — ABNORMAL HIGH (ref 0.44–1.00)
GFR, Estimated: 52 mL/min — ABNORMAL LOW (ref >60)
Glucose, Bld: 114 mg/dL — ABNORMAL HIGH (ref 70–99)
Potassium: 4.2 mmol/L (ref 3.5–5.1)
Sodium: 140 mmol/L (ref 135–145)
Total Bilirubin: 0.4 mg/dL (ref 0.0–1.2)
Total Protein: 6.1 g/dL — ABNORMAL LOW (ref 6.5–8.1)

## 2024-01-25 LAB — CBC WITH DIFFERENTIAL (CANCER CENTER ONLY)
Abs Immature Granulocytes: 3.91 10*3/uL — ABNORMAL HIGH (ref 0.00–0.07)
Basophils Absolute: 0 10*3/uL (ref 0.0–0.1)
Basophils Relative: 0 %
Eosinophils Absolute: 0 10*3/uL (ref 0.0–0.5)
Eosinophils Relative: 0 %
HCT: 35.9 % — ABNORMAL LOW (ref 36.0–46.0)
Hemoglobin: 12.2 g/dL (ref 12.0–15.0)
Immature Granulocytes: 15 %
Lymphocytes Relative: 7 %
Lymphs Abs: 1.9 10*3/uL (ref 0.7–4.0)
MCH: 31.4 pg (ref 26.0–34.0)
MCHC: 34 g/dL (ref 30.0–36.0)
MCV: 92.5 fL (ref 80.0–100.0)
Monocytes Absolute: 2.2 10*3/uL — ABNORMAL HIGH (ref 0.1–1.0)
Monocytes Relative: 8 %
Neutro Abs: 18.7 10*3/uL — ABNORMAL HIGH (ref 1.7–7.7)
Neutrophils Relative %: 70 %
Platelet Count: 160 10*3/uL (ref 150–400)
RBC: 3.88 MIL/uL (ref 3.87–5.11)
RDW: 11.9 % (ref 11.5–15.5)
Smear Review: NORMAL
WBC Count: 26.8 10*3/uL — ABNORMAL HIGH (ref 4.0–10.5)
nRBC: 0.4 % — ABNORMAL HIGH (ref 0.0–0.2)

## 2024-01-25 NOTE — Assessment & Plan Note (Signed)
 Assessment and Plan Assessment & Plan Chemotherapy management Undergoing chemotherapy  s/p first cycle of TC with weakness but no nausea. Elevated WBC due to growth factor expected. Hemoglobin and platelets normal. - Continue current chemotherapy regimen. - Emphasize hydration. - Monitor blood counts regularly. - Schedule next chemotherapy cycle on February 06, 2024.  Rash Slightly itchy rash, unclear cause, possibly related to supplement changes. - Continue multivitamins. - Monitor rash for changes.  Parkinson's disease Variable symptoms with no significant changes in tremor. Improved sleep after stopping certain supplements. - Continue current Parkinson's management. - Monitor symptoms and sleep quality.

## 2024-02-01 ENCOUNTER — Encounter: Payer: Self-pay | Admitting: Hematology and Oncology

## 2024-02-02 ENCOUNTER — Other Ambulatory Visit: Payer: Self-pay | Admitting: *Deleted

## 2024-02-06 ENCOUNTER — Encounter: Payer: Self-pay | Admitting: Hematology and Oncology

## 2024-02-06 ENCOUNTER — Inpatient Hospital Stay: Admitting: Hematology and Oncology

## 2024-02-06 ENCOUNTER — Inpatient Hospital Stay

## 2024-02-06 DIAGNOSIS — R21 Rash and other nonspecific skin eruption: Secondary | ICD-10-CM | POA: Diagnosis not present

## 2024-02-06 DIAGNOSIS — C50212 Malignant neoplasm of upper-inner quadrant of left female breast: Secondary | ICD-10-CM

## 2024-02-06 DIAGNOSIS — Z170 Estrogen receptor positive status [ER+]: Secondary | ICD-10-CM | POA: Diagnosis not present

## 2024-02-06 DIAGNOSIS — G20A1 Parkinson's disease without dyskinesia, without mention of fluctuations: Secondary | ICD-10-CM | POA: Diagnosis not present

## 2024-02-06 DIAGNOSIS — Z5189 Encounter for other specified aftercare: Secondary | ICD-10-CM | POA: Diagnosis not present

## 2024-02-06 DIAGNOSIS — L659 Nonscarring hair loss, unspecified: Secondary | ICD-10-CM | POA: Diagnosis not present

## 2024-02-06 DIAGNOSIS — Z5111 Encounter for antineoplastic chemotherapy: Secondary | ICD-10-CM | POA: Diagnosis not present

## 2024-02-06 LAB — CMP (CANCER CENTER ONLY)
ALT: 6 U/L (ref 0–44)
AST: 13 U/L — ABNORMAL LOW (ref 15–41)
Albumin: 4.2 g/dL (ref 3.5–5.0)
Alkaline Phosphatase: 95 U/L (ref 38–126)
Anion gap: 4 — ABNORMAL LOW (ref 5–15)
BUN: 17 mg/dL (ref 8–23)
CO2: 28 mmol/L (ref 22–32)
Calcium: 9.9 mg/dL (ref 8.9–10.3)
Chloride: 107 mmol/L (ref 98–111)
Creatinine: 0.89 mg/dL (ref 0.44–1.00)
GFR, Estimated: >60 mL/min (ref >60)
Glucose, Bld: 105 mg/dL — ABNORMAL HIGH (ref 70–99)
Potassium: 4.6 mmol/L (ref 3.5–5.1)
Sodium: 139 mmol/L (ref 135–145)
Total Bilirubin: 0.6 mg/dL (ref 0.0–1.2)
Total Protein: 6.7 g/dL (ref 6.5–8.1)

## 2024-02-06 LAB — CBC WITH DIFFERENTIAL (CANCER CENTER ONLY)
Abs Immature Granulocytes: 0.05 10*3/uL (ref 0.00–0.07)
Basophils Absolute: 0 10*3/uL (ref 0.0–0.1)
Basophils Relative: 0 %
Eosinophils Absolute: 0 10*3/uL (ref 0.0–0.5)
Eosinophils Relative: 0 %
HCT: 36.3 % (ref 36.0–46.0)
Hemoglobin: 12.5 g/dL (ref 12.0–15.0)
Immature Granulocytes: 1 %
Lymphocytes Relative: 12 %
Lymphs Abs: 1.2 10*3/uL (ref 0.7–4.0)
MCH: 31.4 pg (ref 26.0–34.0)
MCHC: 34.4 g/dL (ref 30.0–36.0)
MCV: 91.2 fL (ref 80.0–100.0)
Monocytes Absolute: 0.6 10*3/uL (ref 0.1–1.0)
Monocytes Relative: 6 %
Neutro Abs: 8.5 10*3/uL — ABNORMAL HIGH (ref 1.7–7.7)
Neutrophils Relative %: 81 %
Platelet Count: 356 10*3/uL (ref 150–400)
RBC: 3.98 MIL/uL (ref 3.87–5.11)
RDW: 12.7 % (ref 11.5–15.5)
WBC Count: 10.4 10*3/uL (ref 4.0–10.5)
nRBC: 0 % (ref 0.0–0.2)

## 2024-02-06 MED ORDER — SODIUM CHLORIDE 0.9 % IV SOLN
75.0000 mg/m2 | Freq: Once | INTRAVENOUS | Status: AC
  Administered 2024-02-06: 120 mg via INTRAVENOUS
  Filled 2024-02-06: qty 12

## 2024-02-06 MED ORDER — DEXAMETHASONE SODIUM PHOSPHATE 10 MG/ML IJ SOLN
10.0000 mg | Freq: Once | INTRAMUSCULAR | Status: AC
  Administered 2024-02-06: 10 mg via INTRAVENOUS
  Filled 2024-02-06: qty 1

## 2024-02-06 MED ORDER — CYCLOPHOSPHAMIDE CHEMO INJECTION 1 GM
600.0000 mg/m2 | Freq: Once | INTRAMUSCULAR | Status: AC
  Administered 2024-02-06: 1000 mg via INTRAVENOUS
  Filled 2024-02-06: qty 50

## 2024-02-06 MED ORDER — SODIUM CHLORIDE 0.9 % IV SOLN: INTRAVENOUS | Status: DC

## 2024-02-06 MED ORDER — PALONOSETRON HCL INJECTION 0.25 MG/5ML
0.2500 mg | Freq: Once | INTRAVENOUS | Status: AC
  Administered 2024-02-06: 0.25 mg via INTRAVENOUS
  Filled 2024-02-06: qty 5

## 2024-02-06 NOTE — Patient Instructions (Signed)
 [facility] - A DEPT OF [facility]  Discharge Instructions: Thank you for choosing [facility] to provide your oncology and hematology care.   If you have a lab appointment with the Cancer Center, please go directly to the Cancer Center and check in at the registration area.   Wear comfortable clothing and clothing appropriate for easy access to any Portacath or PICC line.   We strive to give you quality time with your provider. You may need to reschedule your appointment if you arrive late (15 or more minutes).  Arriving late affects you and other patients whose appointments are after yours.  Also, if you miss three or more appointments without notifying the office, you may be dismissed from the clinic at the provider's discretion.      For prescription refill requests, have your pharmacy contact our office and allow 72 hours for refills to be completed.    Today you received the following chemotherapy and/or immunotherapy agents: Docetaxel, Cytoxan.       To help prevent nausea and vomiting after your treatment, we encourage you to take your nausea medication as directed.  BELOW ARE SYMPTOMS THAT SHOULD BE REPORTED IMMEDIATELY: *FEVER GREATER THAN 100.4 F (38 C) OR HIGHER *CHILLS OR SWEATING *NAUSEA AND VOMITING THAT IS NOT CONTROLLED WITH YOUR NAUSEA MEDICATION *UNUSUAL SHORTNESS OF BREATH *UNUSUAL BRUISING OR BLEEDING *URINARY PROBLEMS (pain or burning when urinating, or frequent urination) *BOWEL PROBLEMS (unusual diarrhea, constipation, pain near the anus) TENDERNESS IN MOUTH AND THROAT WITH OR WITHOUT PRESENCE OF ULCERS (sore throat, sores in mouth, or a toothache) UNUSUAL RASH, SWELLING OR PAIN  UNUSUAL VAGINAL DISCHARGE OR ITCHING   Items with * indicate a potential emergency and should be followed up as soon as possible or go to the Emergency Department if any problems should occur.  Please show the CHEMOTHERAPY ALERT CARD or  IMMUNOTHERAPY ALERT CARD at check-in to the Emergency Department and triage nurse.  Should you have questions after your visit or need to cancel or reschedule your appointment, please contact [facility] - A DEPT OF [facility]  Dept: [phone]  and follow the prompts.  Office hours are 8:00 a.m. to 4:30 p.m. Monday - Friday. Please note that voicemails left after 4:00 p.m. may not be returned until the following business day.  We are closed weekends and major holidays. You have access to a nurse at all times for urgent questions. Please call the main number to the clinic Dept: [phone] and follow the prompts.   For any non-urgent questions, you may also contact your provider using MyChart. We now offer e-Visits for anyone 72 and older to request care online for non-urgent symptoms. For details visit [email or web address].   Also download the MyChart app! Go to the app store, search "MyChart", open the app, select [facility], and log in with your MyChart username and password.  Docetaxel Injection What is this medication? DOCETAXEL (doe se TAX el) treats some types of cancer. It works by slowing down the growth of cancer cells. This medicine may be used for other purposes; ask your health care provider or pharmacist if you have questions. COMMON BRAND NAME(S): BEIZRAY, Docefrez, Docivyx, Taxotere What should I tell my care team before I take this medication? They need to know if you have any of these conditions: Kidney disease Liver disease Low white blood cell levels Tingling of the fingers or toes  or other nerve disorder An unusual or allergic reaction to docetaxel, polysorbate 80, other medications, foods, dyes, or preservatives Pregnant or trying to get pregnant Breast-feeding How should I use this medication? This medication is injected into a vein. It is given by your care team in a hospital or clinic setting. Talk to your care team about the  use of this medication in children. Special care may be needed. Overdosage: If you think you have taken too much of this medicine contact a poison control center or emergency room at once. NOTE: This medicine is only for you. Do not share this medicine with others. What if I miss a dose? Keep appointments for follow-up doses. It is important not to miss your dose. Call your care team if you are unable to keep an appointment. What may interact with this medication? Do not take this medication with any of the following: Live virus vaccines This medication may also interact with the following: Certain antibiotics, such as clarithromycin, telithromycin Certain antivirals for HIV or hepatitis Certain medications for fungal infections, such as itraconazole, ketoconazole, voriconazole Grapefruit juice Nefazodone Supplements, such as St. John's wort This list may not describe all possible interactions. Give your health care provider a list of all the medicines, herbs, non-prescription drugs, or dietary supplements you use. Also tell them if you smoke, drink alcohol, or use illegal drugs. Some items may interact with your medicine. What should I watch for while using this medication? This medication may make you feel generally unwell. This is not uncommon as chemotherapy can affect healthy cells as well as cancer cells. Report any side effects. Continue your course of treatment even though you feel ill unless your care team tells you to stop. You may need blood work done while you are taking this medication. This medication can cause serious side effects and infusion reactions. To reduce the risk, your care team may give you other medications to take before receiving this one. Be sure to follow the directions from your care team. This medication may increase your risk of getting an infection. Call your care team for advice if you get a fever, chills, sore throat, or other symptoms of a cold or flu. Do not  treat yourself. Try to avoid being around people who are sick. Avoid taking medications that contain aspirin, acetaminophen, ibuprofen, naproxen, or ketoprofen unless instructed by your care team. These medications may hide a fever. Be careful brushing or flossing your teeth or using a toothpick because you may get an infection or bleed more easily. If you have any dental work done, tell your dentist you are receiving this medication. Some products may contain alcohol. Ask your care team if this medication contains alcohol. Be sure to tell all care teams you are taking this medicine. Certain medications, like metronidazole and disulfiram, can cause an unpleasant reaction when taken with alcohol. The reaction includes flushing, headache, nausea, vomiting, sweating, and increased thirst. The reaction can last from 30 minutes to several hours. This medication may affect your coordination, reaction time, or judgement. Do not drive or operate machinery until you know how this medication affects you. Sit up or stand slowly to reduce the risk of dizzy or fainting spells. Drinking alcohol with this medication can increase the risk of these side effects. Talk to your care team about your risk of cancer. You may be more at risk for certain types of cancer if you take this medication. Talk to your care team if you wish to become  pregnant or think you might be pregnant. This medication can cause serious birth defects if taken during pregnancy or if you get pregnant within 2 months after stopping therapy. A negative pregnancy test is required before starting this medication. A reliable form of contraception is recommended while taking this medication and for 2 months after stopping it. Talk to your care team about reliable forms of contraception. Do not breast-feed while taking this medication and for 1 week after stopping therapy. Use a condom during sex and for 4 months after stopping therapy. Tell your care team right  away if you think your partner might be pregnant. This medication can cause serious birth defects. This medication may cause infertility. Talk to your care team if you are concerned about your fertility. What side effects may I notice from receiving this medication? Side effects that you should report to your care team as soon as possible: Allergic reactions--skin rash, itching, hives, swelling of the face, lips, tongue, or throat Change in vision such as blurry vision, seeing halos around lights, vision loss Infection--fever, chills, cough, or sore throat Infusion reactions--chest pain, shortness of breath or trouble breathing, feeling faint or lightheaded Low red blood cell level--unusual weakness or fatigue, dizziness, headache, trouble breathing Pain, tingling, or numbness in the hands or feet Painful swelling, warmth, or redness of the skin, blisters or sores at the infusion site Redness, blistering, peeling, or loosening of the skin, including inside the mouth Sudden or severe stomach pain, bloody diarrhea, fever, nausea, vomiting Swelling of the ankles, hands, or feet Tumor lysis syndrome (TLS)--nausea, vomiting, diarrhea, decrease in the amount of urine, dark urine, unusual weakness or fatigue, confusion, muscle pain or cramps, fast or irregular heartbeat, joint pain Unusual bruising or bleeding Side effects that usually do not require medical attention (report to your care team if they continue or are bothersome): Change in nail shape, thickness, or color Change in taste Hair loss Increased tears This list may not describe all possible side effects. Call your doctor for medical advice about side effects. You may report side effects to FDA at 1-800-FDA-1088. Where should I keep my medication? This medication is given in a hospital or clinic. It will not be stored at home. NOTE: This sheet is a summary. It may not cover all possible information. If you have questions about this  medicine, talk to your doctor, pharmacist, or health care provider.  2024 Elsevier/Gold Standard (2021-12-16 00:00:00) Cyclophosphamide Injection What is this medication? CYCLOPHOSPHAMIDE (sye kloe FOSS fa mide) treats some types of cancer. It works by slowing down the growth of cancer cells. This medicine may be used for other purposes; ask your health care provider or pharmacist if you have questions. COMMON BRAND NAME(S): Cyclophosphamide, Cytoxan, Neosar What should I tell my care team before I take this medication? They need to know if you have any of these conditions: Heart disease Irregular heartbeat or rhythm Infection Kidney problems Liver disease Low blood cell levels (white cells, platelets, or red blood cells) Lung disease Previous radiation Trouble passing urine An unusual or allergic reaction to cyclophosphamide, other medications, foods, dyes, or preservatives Pregnant or trying to get pregnant Breast-feeding How should I use this medication? This medication is injected into a vein. It is given by your care team in a hospital or clinic setting. Talk to your care team about the use of this medication in children. Special care may be needed. Overdosage: If you think you have taken too much of this medicine contact a  poison control center or emergency room at once. NOTE: This medicine is only for you. Do not share this medicine with others. What if I miss a dose? Keep appointments for follow-up doses. It is important not to miss your dose. Call your care team if you are unable to keep an appointment. What may interact with this medication? Amphotericin B Amiodarone Azathioprine Certain antivirals for HIV or hepatitis Certain medications for blood pressure, such as enalapril, lisinopril, quinapril Cyclosporine Diuretics Etanercept Indomethacin Medications that relax muscles Metronidazole Natalizumab Tamoxifen Warfarin This list may not describe all possible  interactions. Give your health care provider a list of all the medicines, herbs, non-prescription drugs, or dietary supplements you use. Also tell them if you smoke, drink alcohol, or use illegal drugs. Some items may interact with your medicine. What should I watch for while using this medication? This medication may make you feel generally unwell. This is not uncommon as chemotherapy can affect healthy cells as well as cancer cells. Report any side effects. Continue your course of treatment even though you feel ill unless your care team tells you to stop. You may need blood work while you are taking this medication. This medication may increase your risk of getting an infection. Call your care team for advice if you get a fever, chills, sore throat, or other symptoms of a cold or flu. Do not treat yourself. Try to avoid being around people who are sick. Avoid taking medications that contain aspirin, acetaminophen, ibuprofen, naproxen, or ketoprofen unless instructed by your care team. These medications may hide a fever. Be careful brushing or flossing your teeth or using a toothpick because you may get an infection or bleed more easily. If you have any dental work done, tell your dentist you are receiving this medication. Drink water or other fluids as directed. Urinate often, even at night. Some products may contain alcohol. Ask your care team if this medication contains alcohol. Be sure to tell all care teams you are taking this medicine. Certain medicines, like metronidazole and disulfiram, can cause an unpleasant reaction when taken with alcohol. The reaction includes flushing, headache, nausea, vomiting, sweating, and increased thirst. The reaction can last from 30 minutes to several hours. Talk to your care team if you wish to become pregnant or think you might be pregnant. This medication can cause serious birth defects if taken during pregnancy and for 1 year after the last dose. A negative  pregnancy test is required before starting this medication. A reliable form of contraception is recommended while taking this medication and for 1 year after the last dose. Talk to your care team about reliable forms of contraception. Do not father a child while taking this medication and for 4 months after the last dose. Use a condom during this time period. Do not breast-feed while taking this medication or for 1 week after the last dose. This medication may cause infertility. Talk to your care team if you are concerned about your fertility. Talk to your care team about your risk of cancer. You may be more at risk for certain types of cancer if you take this medication. What side effects may I notice from receiving this medication? Side effects that you should report to your care team as soon as possible: Allergic reactions--skin rash, itching, hives, swelling of the face, lips, tongue, or throat Dry cough, shortness of breath or trouble breathing Heart failure--shortness of breath, swelling of the ankles, feet, or hands, sudden weight gain, unusual weakness  or fatigue Heart muscle inflammation--unusual weakness or fatigue, shortness of breath, chest pain, fast or irregular heartbeat, dizziness, swelling of the ankles, feet, or hands Heart rhythm changes--fast or irregular heartbeat, dizziness, feeling faint or lightheaded, chest pain, trouble breathing Infection--fever, chills, cough, sore throat, wounds that don't heal, pain or trouble when passing urine, general feeling of discomfort or being unwell Kidney injury--decrease in the amount of urine, swelling of the ankles, hands, or feet Liver injury--right upper belly pain, loss of appetite, nausea, light-colored stool, dark yellow or brown urine, yellowing skin or eyes, unusual weakness or fatigue Low red blood cell level--unusual weakness or fatigue, dizziness, headache, trouble breathing Low sodium level--muscle weakness, fatigue, dizziness,  headache, confusion Red or dark brown urine Unusual bruising or bleeding Side effects that usually do not require medical attention (report to your care team if they continue or are bothersome): Hair loss Irregular menstrual cycles or spotting Loss of appetite Nausea Pain, redness, or swelling with sores inside the mouth or throat Vomiting This list may not describe all possible side effects. Call your doctor for medical advice about side effects. You may report side effects to FDA at 1-800-FDA-1088. Where should I keep my medication? This medication is given in a hospital or clinic. It will not be stored at home. NOTE: This sheet is a summary. It may not cover all possible information. If you have questions about this medicine, talk to your doctor, pharmacist, or health care provider.  2024 Elsevier/Gold Standard (2022-02-25 00:00:00)

## 2024-02-06 NOTE — Assessment & Plan Note (Addendum)
  Assessment & Plan Chemotherapy management Undergoing chemotherapy  s/p first cycle of TC  Tolerating chemotherapy extremely well.  CBC reviewed and okay to proceed, CMP pending at the time of visit.  She will return to clinic before planned cycle 3 of chemotherapy.  Parkinson's disease Variable symptoms with no significant changes in tremor.  - Continue current Parkinson's management. - Monitor symptoms and sleep quality.  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

## 2024-02-06 NOTE — Progress Notes (Signed)
 [facility] CONSULT NOTE  Patient Care Team: [name], PA-C as PCP - General (Family Medicine) Auther Bo, RN as Oncology Nurse Navigator Alane Hsu, RN as Oncology Nurse Navigator Murleen Arms, MD as Consulting Physician (Hematology and Oncology)  CHIEF COMPLAINTS/PURPOSE OF CONSULTATION:  Newly diagnosed breast cancer  HISTORY OF PRESENTING ILLNESS:  Breanna Powers 68 y.o. female is here because of recent diagnosis of left breast cancer.  I reviewed her records extensively and collaborated the history with the patient.  SUMMARY OF ONCOLOGIC HISTORY: Oncology History  Malignant neoplasm of upper-inner quadrant of left breast in female, estrogen receptor positive (HCC)  10/09/2023 Mammogram   Mammogram showed possible asymmetry in the right breast and left breast hence diagnostic mammogram and ultrasound were recommended.  Diagnostic mammogram once again confirmed a 5 x 4 x 4 mm irregular hypoechoic mass left breast at 9 o'clock position 6 cm from the nipple.  No left axillary adenopathy.  Questioned asymmetry in the right breast resolved with additional imaging compatible with dense overlapping fibroglandular tissue.   11/14/2023 Pathology Results   Left breast needle core biopsy at 9:00 showed invasive ductal carcinoma, overall grade 2, prognostics showed ER 95% positive strong staining PR 60% positive weak to moderate staining, Ki-67 of 10% and HER2 1+.   11/20/2023 Initial Diagnosis   Malignant neoplasm of upper-inner quadrant of left breast in female, estrogen receptor positive (HCC)   11/22/2023 Cancer Staging   Staging form: Breast, AJCC 8th Edition - Pathologic stage from 11/22/2023: Stage IA (pT1a, pN0, cM0, G2, ER+, PR+, HER2-) - Signed by Murleen Arms, MD on 11/22/2023 Stage prefix: Initial diagnosis Histologic grading system: 3 grade system Laterality: Left Stage used in treatment planning: Yes National guidelines used in  treatment planning: Yes Type of national guideline used in treatment planning: NCCN   11/30/2023 Genetic Testing   Negative Amb[facility] CancerNext+RNAinsight Panel.  Report date is 11/30/2023.  The [facility] CancerNext+RNAinsight Panel includes sequencing, rearrangement analysis, and RNA analysis for the following 39 genes: APC, ATM, BAP1, BARD1, BMPR1A, BRCA1, BRCA2, BRIP1, CDH1, CDKN2A, CHEK2, FH, FLCN, MET, MLH1, MSH2, MSH6, MUTYH, NF1, NTHL1, PALB2, PMS2, PTEN, RAD51C, RAD51D, SMAD4, STK11, TP53, TSC1, TSC2, and VHL (sequencing and deletion/duplication); AXIN2, HOXB13, MBD4, MSH3, POLD1 and POLE (sequencing only); EPCAM and GREM1 (deletion/duplication only).    12/07/2023 Pathology Results   Grade 2 IDC, Post margin pos, ER: 95%, positive, strong staining intensity  PR: 60%, positive, weak-moderate intensity  Her2: 1+, negative  Ki-67: 10%  LN negative   12/20/2023 Oncotype testing   Oncotype DX of 27, distant recurrence risk at 9 yrs of 16%, group average absolute chemo benefit greater than 15%   01/16/2024 -  Chemotherapy   Patient is on Treatment Plan : BREAST TC q21d      Discussed the use of AI scribe software for clinical note transcription with the patient, who gave verbal consent to proceed.  History of Present Illness Patient is here before planned second cycle of chemotherapy. Since her last visit here, she denies any complaints at all.  No fatigue, nausea, vomiting, diarrhea, tingling or numbness.  She lost all her hair and now wears a hair prosthesis.  No change in her Parkinson symptoms.  Rest of the pertinent 10 point ROS reviewed and negative    MEDICAL HISTORY:  Past Medical History:  Diagnosis Date   Dermatitis    Eczema    Fibroid    Hemiparesis (HCC)    right side "part  of the parkinson's" also had a muscle tear repair in R shoulder   Hypertension    Knee pain    Menopause    Parkinson disease (HCC)     SURGICAL HISTORY: Past Surgical History:  Procedure Laterality  Date   BREAST BIOPSY Left 11/14/2023   [place] LT BREAST BX W LOC DEV 1ST LESION IMG BX SPEC US GUIDE 11/14/2023 [facility]MO[facility]   BREAST BIOPSY Left 12/06/2023   [place] LT RADIOACTIVE SEED LOC 12/06/2023 [facility]   BREAST LUMPECTOMY WITH RADIOACTIVE SEED AND SENTINEL LYMPH NODE BIOPSY Left 12/07/2023   Procedure: LEFT SEED LUMPECTOMY AND SENTINEL LYMPH NODE BIOPSY;  Surgeon: Almond Lint, MD;  Location: [facility];  Service: General;  Laterality: Left;  GEN w/REGIONAL   COLONOSCOPY     2021   CYSTOSCOPY     10/2019   SHOULDER ARTHROSCOPY Right    2018 W biceps tenodesis   TOTAL ABDOMINAL HYSTERECTOMY     10-29-2019   TRIGGER FINGER RELEASE Left    09/29/2020   tummy tuck     04-2023   WISDOM TOOTH EXTRACTION      SOCIAL HISTORY: Social History   Socioeconomic History   Marital status: Married    Spouse name: Not on file   Number of children: 0   Years of education: Assoc   Highest education level: Not on file  Occupational History   Occupation: [employer]  Tobacco Use   Smoking status: Never   Smokeless tobacco: Never  Vaping Use   Vaping status: Never Used  Substance and Sexual Activity   Alcohol use: Yes   Drug use: Never   Sexual activity: Not on file    Comment: TAH  Other Topics Concern   Not on file  Social History Narrative   Patient drinks 3-4 cups of coffee a day    Associates degree   Right handed   Goes to the gym for exercise 3-4 times per week   Social Drivers of Health   Financial Resource Strain: Low Risk  (03/08/2023)   [facility], [facility]   Overall Financial Resource Strain (CARDIA)    Difficulty of Paying Living Expenses: Not hard at all  Food Insecurity: No Food Insecurity (11/22/2023)   Hunger Vital Sign    Worried About Running Out of Food in the Last Year: Never true    Ran Out of Food in the Last Year: Never true  Transportation Needs: No Transportation Needs (11/22/2023)   PRAPARE - Therapist, art (Medical): No    Lack of Transportation (Non-Medical): No  Physical Activity: Insufficiently Active (08/10/2021)   Received from [facility] visits prior to 12/24/2022., [facility] visits prior to 12/24/2022.   Exercise Vital Sign    Days of Exercise per Week: 1 day    Minutes of Exercise per Session: 60 min  Stress: Stress Concern Present (08/10/2021)   Received from [facility] visits prior to 12/24/2022., [facility] visits prior to 12/24/2022.   [employer] of Occupational Health - Occupational Stress Questionnaire    Feeling of Stress : Rather much  Social Connections: Unknown (02/07/2023)   Received from [facility], [facility]   Social Network    Social Network: Not on file  Intimate Partner Violence: Not At Risk (11/22/2023)   Humiliation, Afraid, Rape, and Kick questionnaire    Fear of Current or Ex-Partner:  No    Emotionally Abused: No    Physically Abused: No    Sexually Abused: No    FAMILY HISTORY: Family History  Problem Relation Age of Onset   Neuropathy Mother    Dementia Mother    Depression Mother    Hypertension Mother    Restless legs syndrome Mother    Parkinson's disease Father    Heart disease Father    Breast cancer Sister 81       d. 63   Atrial fibrillation Sister    Osteoporosis Sister    Heart disease Sister    Cancer Maternal Aunt        unk type; ? pancreatic; mets dx >50   Breast cancer Paternal Aunt        dx >50   Breast cancer Paternal Grandmother        mets; d. 100s   Heart disease Paternal Grandfather     ALLERGIES:  has no known allergies.  MEDICATIONS:  Current Outpatient Medications  Medication Sig Dispense Refill   amantadine (SYMMETREL) 100 MG capsule Take 1 capsule (100 mg total) by mouth 2 (two) times daily. 60 capsule 3   Calcium Carb-Cholecalciferol (CALCIUM 600/VITAMIN D3) 600-20 MG-MCG TABS Take by  mouth 2 (two) times daily.     carbidopa-levodopa (SINEMET CR) 50-200 MG tablet Take 1 tablet by mouth at bedtime. 30 tablet 5   carbidopa-levodopa (SINEMET IR) 25-100 MG tablet TAKE 1 TABLET FOUR TIMES A DAY 360 tablet 3   Cholecalciferol (VITAMIN D3) 1000 units CAPS Take 2,000 Units by mouth.     dexamethasone (DECADRON) 4 MG tablet Take 2 tabs by mouth 2 times daily starting day before chemo. Then take 2 tabs daily for 2 days starting day after chemo. Take with food. 30 tablet 1   lidocaine-prilocaine (EMLA) cream Apply to affected area once 30 g 3   losartan (COZAAR) 25 MG tablet Take 25 mg by mouth daily.     Multiple Vitamin (MULTIVITAMIN) tablet Take by mouth.     ondansetron (ZOFRAN) 8 MG tablet Take 1 tablet (8 mg total) by mouth every 8 (eight) hours as needed for nausea or vomiting. Start on the third day after chemotherapy. 30 tablet 1   pramipexole (MIRAPEX) 0.5 MG tablet TAKE 1 TABLET 3 TO 4 TIMES DAILY 360 tablet 3   prochlorperazine (COMPAZINE) 10 MG tablet Take 1 tablet (10 mg total) by mouth every 6 (six) hours as needed for nausea or vomiting. 30 tablet 1   vitamin B-12 (CYANOCOBALAMIN) 500 MCG tablet Take 500 mcg by mouth daily.     No current facility-administered medications for this visit.   Facility-Administered Medications Ordered in Other Visits  Medication Dose Route Frequency Provider Last Rate Last Admin   0.9 %  sodium chloride infusion   Intravenous Continuous Trevis Eden, MD 10 mL/hr at 02/06/24 1056 New Bag at 02/06/24 1056   cyclophosphamide (CYTOXAN) 1,000 mg in sodium chloride 0.9 % 250 mL chemo infusion  600 mg/m2 (Treatment Plan Recorded) Intravenous Once Shantea Poulton, MD       DOCEtaxel (TAXOTERE) 120 mg in sodium chloride 0.9 % 250 mL chemo infusion  75 mg/m2 (Treatment Plan Recorded) Intravenous Once Denissa Cozart, MD        REVIEW OF SYSTEMS:   Constitutional: Denies fevers, chills or abnormal night sweats Eyes: Denies blurriness of vision,  double vision or watery eyes Ears, nose, mouth, throat, and face: Denies mucositis or sore throat Respiratory: Denies cough, dyspnea or  wheezes Cardiovascular: Denies palpitation, chest discomfort or lower extremity swelling Gastrointestinal:  Denies nausea, heartburn or change in bowel habits Skin: Denies abnormal skin rashes Lymphatics: Denies new lymphadenopathy or easy bruising Neurological:Denies numbness, tingling or new weaknesses Behavioral/Psych: Mood is stable, no new changes  Breast: Denies any palpable lumps or discharge All other systems were reviewed with the patient and are negative.  PHYSICAL EXAMINATION: ECOG PERFORMANCE STATUS: 0 - Asymptomatic  Vitals:   02/06/24 1011  BP: (!) 142/70  Pulse: 85  Resp: 16  Temp: (!) 97.4 F (36.3 C)  SpO2: 99%     Filed Weights   02/06/24 1011  Weight: 128 lb 3.2 oz (58.2 kg)      GENERAL:alert, no distress and comfortable Left breast healed well. No port CTA bilaterally RRR  Abdomen: soft, non tender, non distended No LE edema.  LABORATORY DATA:  I have reviewed the data as listed Lab Results  Component Value Date   WBC 10.4 02/06/2024   HGB 12.5 02/06/2024   HCT 36.3 02/06/2024   MCV 91.2 02/06/2024   PLT 356 02/06/2024   Lab Results  Component Value Date   NA 139 02/06/2024   K 4.6 02/06/2024   CL 107 02/06/2024   CO2 28 02/06/2024    RADIOGRAPHIC STUDIES: I have personally reviewed the radiological reports and agreed with the findings in the report.  ASSESSMENT AND PLAN:   Malignant neoplasm of upper-inner quadrant of left breast in female, estrogen receptor positive [facility])  Assessment & Plan Chemotherapy management Undergoing chemotherapy  s/p first cycle of TC  Tolerating chemotherapy extremely well.  CBC reviewed and okay to proceed, CMP pending at the time of visit.  She will return to clinic before planned cycle 3 of chemotherapy.  Parkinson's disease Variable symptoms with no  significant changes in tremor.  - Continue current Parkinson's management. - Monitor symptoms and sleep quality.  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        Murleen Arms, MD 02/06/24

## 2024-02-08 ENCOUNTER — Inpatient Hospital Stay

## 2024-02-08 VITALS — BP 119/73 | HR 96 | Temp 98.0°F | Resp 18

## 2024-02-08 DIAGNOSIS — I10 Essential (primary) hypertension: Secondary | ICD-10-CM | POA: Diagnosis not present

## 2024-02-08 DIAGNOSIS — Z170 Estrogen receptor positive status [ER+]: Secondary | ICD-10-CM | POA: Diagnosis not present

## 2024-02-08 DIAGNOSIS — Z5111 Encounter for antineoplastic chemotherapy: Secondary | ICD-10-CM | POA: Diagnosis not present

## 2024-02-08 DIAGNOSIS — L659 Nonscarring hair loss, unspecified: Secondary | ICD-10-CM | POA: Diagnosis not present

## 2024-02-08 DIAGNOSIS — Z5189 Encounter for other specified aftercare: Secondary | ICD-10-CM | POA: Diagnosis not present

## 2024-02-08 DIAGNOSIS — Z6822 Body mass index (BMI) 22.0-22.9, adult: Secondary | ICD-10-CM | POA: Diagnosis not present

## 2024-02-08 DIAGNOSIS — C50212 Malignant neoplasm of upper-inner quadrant of left female breast: Secondary | ICD-10-CM | POA: Diagnosis not present

## 2024-02-08 DIAGNOSIS — G20A1 Parkinson's disease without dyskinesia, without mention of fluctuations: Secondary | ICD-10-CM | POA: Diagnosis not present

## 2024-02-08 DIAGNOSIS — R21 Rash and other nonspecific skin eruption: Secondary | ICD-10-CM | POA: Diagnosis not present

## 2024-02-08 MED ORDER — PEGFILGRASTIM-JMDB 6 MG/0.6ML ~~LOC~~ SOSY
6.0000 mg | PREFILLED_SYRINGE | Freq: Once | SUBCUTANEOUS | Status: AC
  Administered 2024-02-08: 6 mg via SUBCUTANEOUS
  Filled 2024-02-08: qty 0.6

## 2024-02-19 ENCOUNTER — Encounter: Payer: Self-pay | Admitting: Neurology

## 2024-02-19 MED ORDER — AMANTADINE HCL 100 MG PO CAPS: 100.0000 mg | ORAL_CAPSULE | Freq: Two times a day (BID) | ORAL | 0 refills | Status: DC

## 2024-02-27 ENCOUNTER — Inpatient Hospital Stay: Attending: Hematology and Oncology

## 2024-02-27 ENCOUNTER — Inpatient Hospital Stay (HOSPITAL_BASED_OUTPATIENT_CLINIC_OR_DEPARTMENT_OTHER): Admitting: Adult Health

## 2024-02-27 ENCOUNTER — Inpatient Hospital Stay

## 2024-02-27 ENCOUNTER — Encounter: Payer: Self-pay | Admitting: Adult Health

## 2024-02-27 VITALS — BP 137/73 | HR 92 | Temp 98.1°F | Resp 18 | Ht 62.0 in | Wt 130.0 lb

## 2024-02-27 DIAGNOSIS — C50212 Malignant neoplasm of upper-inner quadrant of left female breast: Secondary | ICD-10-CM | POA: Diagnosis not present

## 2024-02-27 DIAGNOSIS — Z5111 Encounter for antineoplastic chemotherapy: Secondary | ICD-10-CM | POA: Diagnosis not present

## 2024-02-27 DIAGNOSIS — Z170 Estrogen receptor positive status [ER+]: Secondary | ICD-10-CM

## 2024-02-27 DIAGNOSIS — D649 Anemia, unspecified: Secondary | ICD-10-CM | POA: Diagnosis not present

## 2024-02-27 DIAGNOSIS — E875 Hyperkalemia: Secondary | ICD-10-CM | POA: Diagnosis not present

## 2024-02-27 DIAGNOSIS — R5383 Other fatigue: Secondary | ICD-10-CM | POA: Insufficient documentation

## 2024-02-27 DIAGNOSIS — G20A1 Parkinson's disease without dyskinesia, without mention of fluctuations: Secondary | ICD-10-CM | POA: Diagnosis not present

## 2024-02-27 DIAGNOSIS — Z5189 Encounter for other specified aftercare: Secondary | ICD-10-CM | POA: Insufficient documentation

## 2024-02-27 LAB — CBC WITH DIFFERENTIAL (CANCER CENTER ONLY)
Abs Immature Granulocytes: 0.03 10*3/uL (ref 0.00–0.07)
Basophils Absolute: 0 10*3/uL (ref 0.0–0.1)
Basophils Relative: 0 %
Eosinophils Absolute: 0 10*3/uL (ref 0.0–0.5)
Eosinophils Relative: 0 %
HCT: 35.6 % — ABNORMAL LOW (ref 36.0–46.0)
Hemoglobin: 12 g/dL (ref 12.0–15.0)
Immature Granulocytes: 0 %
Lymphocytes Relative: 13 %
Lymphs Abs: 1.3 10*3/uL (ref 0.7–4.0)
MCH: 31.5 pg (ref 26.0–34.0)
MCHC: 33.7 g/dL (ref 30.0–36.0)
MCV: 93.4 fL (ref 80.0–100.0)
Monocytes Absolute: 0.5 10*3/uL (ref 0.1–1.0)
Monocytes Relative: 5 %
Neutro Abs: 8.1 10*3/uL — ABNORMAL HIGH (ref 1.7–7.7)
Neutrophils Relative %: 82 %
Platelet Count: 365 10*3/uL (ref 150–400)
RBC: 3.81 MIL/uL — ABNORMAL LOW (ref 3.87–5.11)
RDW: 15.1 % (ref 11.5–15.5)
WBC Count: 9.9 10*3/uL (ref 4.0–10.5)
nRBC: 0 % (ref 0.0–0.2)

## 2024-02-27 LAB — CMP (CANCER CENTER ONLY)
ALT: 7 U/L (ref 0–44)
AST: 13 U/L — ABNORMAL LOW (ref 15–41)
Albumin: 4.2 g/dL (ref 3.5–5.0)
Alkaline Phosphatase: 99 U/L (ref 38–126)
Anion gap: 8 (ref 5–15)
BUN: 20 mg/dL (ref 8–23)
CO2: 27 mmol/L (ref 22–32)
Calcium: 10 mg/dL (ref 8.9–10.3)
Chloride: 105 mmol/L (ref 98–111)
Creatinine: 0.85 mg/dL (ref 0.44–1.00)
GFR, Estimated: >60 mL/min (ref >60)
Glucose, Bld: 125 mg/dL — ABNORMAL HIGH (ref 70–99)
Potassium: 5.2 mmol/L — ABNORMAL HIGH (ref 3.5–5.1)
Sodium: 140 mmol/L (ref 135–145)
Total Bilirubin: 0.5 mg/dL (ref 0.0–1.2)
Total Protein: 6.8 g/dL (ref 6.5–8.1)

## 2024-02-27 MED ORDER — PALONOSETRON HCL INJECTION 0.25 MG/5ML
0.2500 mg | Freq: Once | INTRAVENOUS | Status: AC
  Administered 2024-02-27: 0.25 mg via INTRAVENOUS
  Filled 2024-02-27: qty 5

## 2024-02-27 MED ORDER — SODIUM CHLORIDE 0.9 % IV SOLN: INTRAVENOUS | Status: DC

## 2024-02-27 MED ORDER — SODIUM CHLORIDE 0.9 % IV SOLN
75.0000 mg/m2 | Freq: Once | INTRAVENOUS | Status: AC
  Administered 2024-02-27: 120 mg via INTRAVENOUS
  Filled 2024-02-27: qty 12

## 2024-02-27 MED ORDER — SODIUM CHLORIDE 0.9 % IV SOLN
600.0000 mg/m2 | Freq: Once | INTRAVENOUS | Status: AC
  Administered 2024-02-27: 1000 mg via INTRAVENOUS
  Filled 2024-02-27: qty 50

## 2024-02-27 MED ORDER — DEXAMETHASONE SODIUM PHOSPHATE 10 MG/ML IJ SOLN
10.0000 mg | Freq: Once | INTRAMUSCULAR | Status: AC
  Administered 2024-02-27: 10 mg via INTRAVENOUS
  Filled 2024-02-27: qty 1

## 2024-02-27 NOTE — Progress Notes (Signed)
 [id number]- Effectiveness of Out-of Pocket Cost Communication and Financial Navigation (CostCOM) in Cancer Patients:    Provider Alwin Baars messaged in secure chat that Mrs. Breanna Powers had a question about her recent grant application prepared with [employer] through this CostCom study today during her appointment. Met patient in infusion room during her infusion appointment today. Patient was concerned household income listed on her grant application with [employer] was not accurate. Patient showed the application, what the income currently read on the application; and she had a sticky note listing what she recognized her income should be. Patient will reach out to tailormed counselor regarding application. I have also sent an email to [employer] asking to have counselor and/or appropriate person reach out to patient to correct any issues or answer any questions she may have.  Pinkey Brier, MPH  Clinical Research Coordinator

## 2024-02-27 NOTE — Assessment & Plan Note (Signed)
 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    Current treatment: Taxotere  and Cytoxan   Fatigue: recommended management with energy conservation Hyperkalemia: Potassium was 5.2 today.  She is having normal bowel movements and is not taking any potassium supplementation.  I recommended that she stop taking her multivitamin and any other vitamin that was nonessential.  Her labs today were reviewed and are within parameters for her to proceed with therapy without dose modifications.  She will return in 2 days for her growth factor injection and again in 3 weeks for final treatment of Taxotere  and Cytoxan .

## 2024-02-27 NOTE — Progress Notes (Signed)
 Breanna Powers Cancer Follow up:    Breanna Elders, PA-C [address] [place] [address]   DIAGNOSIS:  Cancer Staging  Malignant neoplasm of upper-inner quadrant of left breast in female, estrogen receptor positive (HCC) Staging form: Breast, AJCC 8th Edition - Pathologic stage from 11/22/2023: Stage IA (pT1a, pN0, cM0, G2, ER+, PR+, HER2-) - Signed by Breanna Arms, MD on 11/22/2023 Stage prefix: Initial diagnosis Histologic grading system: 3 grade system Laterality: Left Stage used in treatment planning: Yes National guidelines used in treatment planning: Yes Type of national guideline used in treatment planning: NCCN    SUMMARY OF ONCOLOGIC HISTORY: Oncology History  Malignant neoplasm of upper-inner quadrant of left breast in female, estrogen receptor positive (HCC)  10/09/2023 Mammogram   Mammogram showed possible asymmetry in the right breast and left breast hence diagnostic mammogram and ultrasound were recommended.  Diagnostic mammogram once again confirmed a 5 x 4 x 4 mm irregular hypoechoic mass left breast at 9 o'clock position 6 cm from the nipple.  No left axillary adenopathy.  Questioned asymmetry in the right breast resolved with additional imaging compatible with dense overlapping fibroglandular tissue.   11/14/2023 Pathology Results   Left breast needle core biopsy at 9:00 showed invasive ductal carcinoma, overall grade 2, prognostics showed ER 95% positive strong staining PR 60% positive weak to moderate staining, Ki-67 of 10% and HER2 1+.   11/20/2023 Initial Diagnosis   Malignant neoplasm of upper-inner quadrant of left breast in female, estrogen receptor positive (HCC)   11/22/2023 Cancer Staging   Staging form: Breast, AJCC 8th Edition - Pathologic stage from 11/22/2023: Stage IA (pT1a, pN0, cM0, G2, ER+, PR+, HER2-) - Signed by Breanna Arms, MD on 11/22/2023 Stage prefix: Initial diagnosis Histologic grading system: 3 grade  system Laterality: Left Stage used in treatment planning: Yes National guidelines used in treatment planning: Yes Type of national guideline used in treatment planning: NCCN   11/30/2023 Genetic Testing   Negative [facility] CancerNext+RNAinsight Panel.  Report date is 11/30/2023.  The [facility] CancerNext+RNAinsight Panel includes sequencing, rearrangement analysis, and RNA analysis for the following 39 genes: APC, ATM, BAP1, BARD1, BMPR1A, BRCA1, BRCA2, BRIP1, CDH1, CDKN2A, CHEK2, FH, FLCN, MET, MLH1, MSH2, MSH6, MUTYH, NF1, NTHL1, PALB2, PMS2, PTEN, RAD51C, RAD51D, SMAD4, STK11, TP53, TSC1, TSC2, and VHL (sequencing and deletion/duplication); AXIN2, HOXB13, MBD4, MSH3, POLD1 and POLE (sequencing only); EPCAM and GREM1 (deletion/duplication only).    12/07/2023 Pathology Results   Grade 2 IDC, Post margin pos, ER: 95%, positive, strong staining intensity  PR: 60%, positive, weak-moderate intensity  Her2: 1+, negative  Ki-67: 10%  LN negative   12/20/2023 Oncotype testing   Oncotype DX of 27, distant recurrence risk at 9 yrs of 16%, group average absolute chemo benefit greater than 15%   01/16/2024 -  Chemotherapy   Patient is on Treatment Plan : BREAST TC q21d       CURRENT THERAPY: Taxotere /Cytoxan   INTERVAL HISTORY:  Breanna Powers 68 y.o. female returns for follow-up and evaluation prior to receiving her third cycle of Taxotere  and Cytoxan .  She is doing moderately well today.  She notes fatigue the first week or so after her treatment that then resolves.  She is feeling better today.  She denies any peripheral neuropathy.  Otherwise she is tolerating treatment well.   Patient Active Problem List   Diagnosis Date Noted   Genetic testing 11/30/2023   Malignant neoplasm of upper-inner quadrant of left breast in female, estrogen receptor positive (HCC)  11/20/2023   Hemiparesis (HCC) 06/30/2016   KNEE PAIN 09/16/2008   ITBS, RIGHT KNEE 09/16/2008   UNEQUAL LEG LENGTH 09/16/2008    has  no known allergies.  MEDICAL HISTORY: Past Medical History:  Diagnosis Date   Dermatitis    Eczema    Fibroid    Hemiparesis (HCC)    right side "part of the parkinson's" also had a muscle tear repair in R shoulder   Hypertension    Knee pain    Menopause    Parkinson disease (HCC)     SURGICAL HISTORY: Past Surgical History:  Procedure Laterality Date   BREAST BIOPSY Left 11/14/2023   US  LT BREAST BX W LOC DEV 1ST LESION IMG BX SPEC US  GUIDE 11/14/2023 [facility]   BREAST BIOPSY Left 12/06/2023   US  LT RADIOACTIVE SEED LOC 12/06/2023 [facility] MAMMOGRAPHY   BREAST LUMPECTOMY WITH RADIOACTIVE SEED AND SENTINEL LYMPH NODE BIOPSY Left 12/07/2023   Procedure: LEFT SEED LUMPECTOMY AND SENTINEL LYMPH NODE BIOPSY;  Surgeon: Breanna Rima, MD;  Location: [facility];  Service: General;  Laterality: Left;  GEN w/REGIONAL   COLONOSCOPY     2021   CYSTOSCOPY     10/2019   SHOULDER ARTHROSCOPY Right    2018 W biceps tenodesis   TOTAL ABDOMINAL HYSTERECTOMY     10-29-2019   TRIGGER FINGER RELEASE Left    09/29/2020   tummy tuck     04-2023   WISDOM TOOTH EXTRACTION      SOCIAL HISTORY: Social History   Socioeconomic History   Marital status: Married    Spouse name: Not on file   Number of children: 0   Years of education: Assoc   Highest education level: Not on file  Occupational History   Occupation: [employer]  Tobacco Use   Smoking status: Never   Smokeless tobacco: Never  Vaping Use   Vaping status: Never Used  Substance and Sexual Activity   Alcohol use: Yes   Drug use: Never   Sexual activity: Not on file    Comment: TAH  Other Topics Concern   Not on file  Social History Narrative   Patient drinks 3-4 cups of coffee a day    Associates degree   Right handed   Goes to the gym for exercise 3-4 times per week   Social Drivers of Health   Financial Resource Strain: Low Risk  (03/08/2023)   Received from [facility], [facility]   Overall  Financial Resource Strain (CARDIA)    Difficulty of Paying Living Expenses: Not hard at all  Food Insecurity: No Food Insecurity (11/22/2023)   Hunger Vital Sign    Worried About Running Out of Food in the Last Year: Never true    Ran Out of Food in the Last Year: Never true  Transportation Needs: No Transportation Needs (11/22/2023)   PRAPARE - Administrator, Civil Service (Medical): No    Lack of Transportation (Non-Medical): No  Physical Activity: Insufficiently Active (08/10/2021)   [facility] from [facility] visits prior to 12/24/2022., [facility] visits prior to 12/24/2022.   Exercise Vital Sign    Days of Exercise per Week: 1 day    Minutes of Exercise per Session: 60 min  Stress: Stress Concern Present (08/10/2021)   Received from [facility] visits prior to 12/24/2022., [facility] visits prior to 12/24/2022.   [employer] of Occupational Health -  Occupational Stress Questionnaire    Feeling of Stress : Rather much  Social Connections: Unknown (02/07/2023)   Received from [facility], [facility]   Social Network    Social Network: Not on file  Intimate Partner Violence: Not At Risk (11/22/2023)   Humiliation, Afraid, Rape, and Kick questionnaire    Fear of Current or Ex-Partner: No    Emotionally Abused: No    Physically Abused: No    Sexually Abused: No    FAMILY HISTORY: Family History  Problem Relation Age of Onset   Neuropathy Mother    Dementia Mother    Depression Mother    Hypertension Mother    Restless legs syndrome Mother    Parkinson's disease Father    Heart disease Father    Breast cancer Sister 35       d. 37   Atrial fibrillation Sister    Osteoporosis Sister    Heart disease Sister    Cancer Maternal Aunt        unk type; ? pancreatic; mets dx >50   Breast cancer Paternal Aunt        dx >50   Breast cancer Paternal Grandmother        mets; d.  22s   Heart disease Paternal Grandfather     Review of Systems  Constitutional:  Negative for appetite change, chills, fatigue, fever and unexpected weight change.  HENT:   Negative for hearing loss, lump/mass and trouble swallowing.   Eyes:  Negative for eye problems and icterus.  Respiratory:  Negative for chest tightness, cough and shortness of breath.   Cardiovascular:  Negative for chest pain, leg swelling and palpitations.  Gastrointestinal:  Negative for abdominal distention, abdominal pain, constipation, diarrhea, nausea and vomiting.  Endocrine: Negative for hot flashes.  Genitourinary:  Negative for difficulty urinating.   Musculoskeletal:  Negative for arthralgias.  Skin:  Negative for itching and rash.  Neurological:  Negative for dizziness, extremity weakness, headaches and numbness.  Hematological:  Negative for adenopathy. Does not bruise/bleed easily.  Psychiatric/Behavioral:  Negative for depression. The patient is not nervous/anxious.       PHYSICAL EXAMINATION    Vitals:   02/27/24 0959  BP: 137/73  Pulse: 92  Resp: 18  Temp: 98.1 F (36.7 C)    Physical Exam Constitutional:      General: She is not in acute distress.    Appearance: Normal appearance. She is not toxic-appearing.  HENT:     Head: Normocephalic and atraumatic.     Mouth/Throat:     Mouth: Mucous membranes are moist.     Pharynx: Oropharynx is clear. No oropharyngeal exudate or posterior oropharyngeal erythema.  Eyes:     General: No scleral icterus. Cardiovascular:     Rate and Rhythm: Normal rate and regular rhythm.     Pulses: Normal pulses.     Heart sounds: Normal heart sounds.  Pulmonary:     Effort: Pulmonary effort is normal.     Breath sounds: Normal breath sounds.  Abdominal:     General: Abdomen is flat. Bowel sounds are normal. There is no distension.     Palpations: Abdomen is soft.     Tenderness: There is no abdominal tenderness.  Musculoskeletal:        General:  No swelling.     Cervical back: Neck supple.  Lymphadenopathy:     Cervical: No cervical adenopathy.  Skin:    General: Skin is warm and dry.  Findings: No rash.  Neurological:     General: No focal deficit present.     Mental Status: She is alert.  Psychiatric:        Mood and Affect: Mood normal.        Behavior: Behavior normal.     LABORATORY DATA:  CBC    Component Value Date/Time   WBC 9.9 02/27/2024 0912   RBC 3.81 (L) 02/27/2024 0912   HGB 12.0 02/27/2024 0912   HCT 35.6 (L) 02/27/2024 0912   PLT 365 02/27/2024 0912   MCV 93.4 02/27/2024 0912   MCH 31.5 02/27/2024 0912   MCHC 33.7 02/27/2024 0912   RDW 15.1 02/27/2024 0912   LYMPHSABS 1.3 02/27/2024 0912   MONOABS 0.5 02/27/2024 0912   EOSABS 0.0 02/27/2024 0912   BASOSABS 0.0 02/27/2024 0912    CMP     Component Value Date/Time   NA 140 02/27/2024 0912   K 5.2 (H) 02/27/2024 0912   CL 105 02/27/2024 0912   CO2 27 02/27/2024 0912   GLUCOSE 125 (H) 02/27/2024 0912   BUN 20 02/27/2024 0912   CREATININE 0.85 02/27/2024 0912   CALCIUM 10.0 02/27/2024 0912   PROT 6.8 02/27/2024 0912   ALBUMIN 4.2 02/27/2024 0912   AST 13 (L) 02/27/2024 0912   ALT 7 02/27/2024 0912   ALKPHOS 99 02/27/2024 0912   BILITOT 0.5 02/27/2024 0912   GFRNONAA >60 02/27/2024 0912     ASSESSMENT and THERAPY PLAN:   Malignant neoplasm of upper-inner quadrant of left breast in female, estrogen receptor positive (HCC) Breanna Powers is a 68 year old woman with history of 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    Current treatment: Taxotere  and Cytoxan   Fatigue: recommended management with energy conservation Hyperkalemia: Potassium was 5.2 today.  She is having normal bowel movements and is not taking any potassium supplementation.  I recommended that she stop taking her  multivitamin and any other vitamin that was nonessential.  Her labs today were reviewed and are within parameters for her to proceed with therapy without dose modifications.  She will return in 2 days for her growth factor injection and again in 3 weeks for final treatment of Taxotere  and Cytoxan .     All questions were answered. The patient knows to call the clinic with any problems, questions or concerns. We can certainly see the patient much sooner if necessary.  Total encounter time:20 minutes*in face-to-face visit time, chart review, lab review, care coordination, order entry, and documentation of the encounter time.    Alwin Baars, NP 02/27/24 11:14 AM Medical Oncology and Hematology [facility] [address] [place], [place] [address] Tel. [phone]    Fax. [phone]  *Total Encounter Time as defined by the Centers for Medicare and Medicaid Services includes, in addition to the face-to-face time of a patient visit (documented in the note above) non-face-to-face time: obtaining and reviewing outside history, ordering and reviewing medications, tests or procedures, care coordination (communications with other health care professionals or caregivers) and documentation in the medical record.

## 2024-02-27 NOTE — Patient Instructions (Signed)
 [facility] - A DEPT OF [facility]  Discharge Instructions: Thank you for choosing [facility] to provide your oncology and hematology care.   If you have a lab appointment with the Cancer Center, please go directly to the Cancer Center and check in at the registration area.   Wear comfortable clothing and clothing appropriate for easy access to any Portacath or PICC line.   We strive to give you quality time with your provider. You may need to reschedule your appointment if you arrive late (15 or more minutes).  Arriving late affects you and other patients whose appointments are after yours.  Also, if you miss three or more appointments without notifying the office, you may be dismissed from the clinic at the provider's discretion.      For prescription refill requests, have your pharmacy contact our office and allow 72 hours for refills to be completed.    Today you received the following chemotherapy and/or immunotherapy agents docetaxel , cyclophosphamide       To help prevent nausea and vomiting after your treatment, we encourage you to take your nausea medication as directed.  BELOW ARE SYMPTOMS THAT SHOULD BE REPORTED IMMEDIATELY: *FEVER GREATER THAN 100.4 F (38 C) OR HIGHER *CHILLS OR SWEATING *NAUSEA AND VOMITING THAT IS NOT CONTROLLED WITH YOUR NAUSEA MEDICATION *UNUSUAL SHORTNESS OF BREATH *UNUSUAL BRUISING OR BLEEDING *URINARY PROBLEMS (pain or burning when urinating, or frequent urination) *BOWEL PROBLEMS (unusual diarrhea, constipation, pain near the anus) TENDERNESS IN MOUTH AND THROAT WITH OR WITHOUT PRESENCE OF ULCERS (sore throat, sores in mouth, or a toothache) UNUSUAL RASH, SWELLING OR PAIN  UNUSUAL VAGINAL DISCHARGE OR ITCHING   Items with * indicate a potential emergency and should be followed up as soon as possible or go to the Emergency Department if any problems should occur.  Please show the CHEMOTHERAPY ALERT CARD  or IMMUNOTHERAPY ALERT CARD at check-in to the Emergency Department and triage nurse.  Should you have questions after your visit or need to cancel or reschedule your appointment, please contact [facility] - A DEPT OF [facility]  Dept: [phone]  and follow the prompts.  Office hours are 8:00 a.m. to 4:30 p.m. Monday - Friday. Please note that voicemails left after 4:00 p.m. may not be returned until the following business day.  We are closed weekends and major holidays. You have access to a nurse at all times for urgent questions. Please call the main number to the clinic Dept: [phone] and follow the prompts.   For any non-urgent questions, you may also contact your provider using MyChart. We now offer e-Visits for anyone 33 and older to request care online for non-urgent symptoms. For details visit [email or web address].   Also download the MyChart app! Go to the app store, search "MyChart", open the app, select [place], and log in with your MyChart username and password.

## 2024-02-29 ENCOUNTER — Inpatient Hospital Stay

## 2024-02-29 VITALS — BP 129/70 | HR 99 | Temp 98.4°F | Resp 18

## 2024-02-29 DIAGNOSIS — C50212 Malignant neoplasm of upper-inner quadrant of left female breast: Secondary | ICD-10-CM

## 2024-02-29 DIAGNOSIS — R5383 Other fatigue: Secondary | ICD-10-CM | POA: Diagnosis not present

## 2024-02-29 DIAGNOSIS — Z5111 Encounter for antineoplastic chemotherapy: Secondary | ICD-10-CM | POA: Diagnosis not present

## 2024-02-29 DIAGNOSIS — Z170 Estrogen receptor positive status [ER+]: Secondary | ICD-10-CM | POA: Diagnosis not present

## 2024-02-29 DIAGNOSIS — G20A1 Parkinson's disease without dyskinesia, without mention of fluctuations: Secondary | ICD-10-CM | POA: Diagnosis not present

## 2024-02-29 DIAGNOSIS — Z5189 Encounter for other specified aftercare: Secondary | ICD-10-CM | POA: Diagnosis not present

## 2024-02-29 DIAGNOSIS — D649 Anemia, unspecified: Secondary | ICD-10-CM | POA: Diagnosis not present

## 2024-02-29 DIAGNOSIS — E875 Hyperkalemia: Secondary | ICD-10-CM | POA: Diagnosis not present

## 2024-02-29 MED ORDER — PEGFILGRASTIM-JMDB 6 MG/0.6ML ~~LOC~~ SOSY
6.0000 mg | PREFILLED_SYRINGE | Freq: Once | SUBCUTANEOUS | Status: AC
  Administered 2024-02-29: 6 mg via SUBCUTANEOUS
  Filled 2024-02-29: qty 0.6

## 2024-03-04 ENCOUNTER — Ambulatory Visit: Attending: General Surgery

## 2024-03-04 VITALS — Wt 128.4 lb

## 2024-03-04 DIAGNOSIS — Z483 Aftercare following surgery for neoplasm: Secondary | ICD-10-CM | POA: Insufficient documentation

## 2024-03-04 NOTE — Therapy (Signed)
 OUTPATIENT PHYSICAL THERAPY SOZO SCREENING NOTE   Patient Name: Breanna Powers MRN: [id number] DOB:1955-12-02, 68 y.o., female Today's Date: 03/04/2024  PCP: [name], PA-C REFERRING PROVIDER: Lockie Rima, MD   PT End of Session - 03/04/24 [phone]     Visit Number 3   # unchanged due to screen only   PT Start Time 0904    PT Stop Time 0911    PT Time Calculation (min) 7 min    Activity Tolerance Patient tolerated treatment well    Behavior During Therapy WFL for tasks assessed/performed             Past Medical History:  Diagnosis Date   Dermatitis    Eczema    Fibroid    Hemiparesis (HCC)    right side "part of the parkinson's" also had a muscle tear repair in R shoulder   Hypertension    Knee pain    Menopause    Parkinson disease (HCC)    Past Surgical History:  Procedure Laterality Date   BREAST BIOPSY Left 11/14/2023   US  LT BREAST BX W LOC DEV 1ST LESION IMG BX SPEC US  GUIDE 11/14/2023 [facility] MAMMOGRAPHY   BREAST BIOPSY Left 12/06/2023   US  LT RADIOACTIVE SEED LOC 12/06/2023 [facility] MAMMOGRAPHY   BREAST LUMPECTOMY WITH RADIOACTIVE SEED AND SENTINEL LYMPH NODE BIOPSY Left 12/07/2023   Procedure: LEFT SEED LUMPECTOMY AND SENTINEL LYMPH NODE BIOPSY;  Surgeon: Lockie Rima, MD;  Location: [facility];  Service: General;  Laterality: Left;  GEN w/REGIONAL   COLONOSCOPY     2021   CYSTOSCOPY     10/2019   SHOULDER ARTHROSCOPY Right    2018 W biceps tenodesis   TOTAL ABDOMINAL HYSTERECTOMY     10-29-2019   TRIGGER FINGER RELEASE Left    09/29/2020   tummy tuck     04-2023   WISDOM TOOTH EXTRACTION     Patient Active Problem List   Diagnosis Date Noted   Genetic testing 11/30/2023   Malignant neoplasm of upper-inner quadrant of left breast in female, estrogen receptor positive (HCC) 11/20/2023   Hemiparesis (HCC) 06/30/2016   KNEE PAIN 09/16/2008   ITBS, RIGHT KNEE 09/16/2008   UNEQUAL LEG LENGTH 09/16/2008    REFERRING DIAG: left  breast cancer at risk for lymphedema  THERAPY DIAG:  Aftercare following surgery for neoplasm  PERTINENT HISTORY: Patient was diagnosed on 11/16/2023 with left grade 2 invasive ductal carcinoma breast cancer. She underwent a left lumpectomy and sentinel node biopsy on 12/07/2023 (2 negative nodes). It is ER/PR positive and HER2 negative with a Ki67 of 10%. She has a right shoulder muscle repair in 2018 resulting in right shoulder tightness. She also has well-managed Parkinson's.  PRECAUTIONS: left UE Lymphedema risk, None  SUBJECTIVE: Pt returns for her first 3 month L-Dex screen.   PAIN:  Are you having pain? No  SOZO SCREENING: Patient was assessed today using the SOZO machine to determine the lymphedema index score. This was compared to her baseline score. It was determined that she is within the recommended range when compared to her baseline and no further action is needed at this time. She will continue SOZO screenings. These are done every 3 months for 2 years post operatively followed by every 6 months for 2 years, and then annually.   L-DEX FLOWSHEETS - 03/04/24 0900       L-DEX LYMPHEDEMA SCREENING   Measurement Type Unilateral    L-DEX MEASUREMENT EXTREMITY Upper Extremity  POSITION  Standing    DOMINANT SIDE Right    At Risk Side Left    BASELINE SCORE (UNILATERAL) 10.6    L-DEX SCORE (UNILATERAL) 5.1    VALUE CHANGE (UNILAT) -5.5               Denyce Flank, PTA 03/04/2024, 9:12 AM

## 2024-03-05 ENCOUNTER — Other Ambulatory Visit: Payer: Self-pay

## 2024-03-14 ENCOUNTER — Encounter: Payer: Self-pay | Admitting: Neurology

## 2024-03-14 ENCOUNTER — Ambulatory Visit: Payer: Medicare Other | Admitting: Neurology

## 2024-03-14 VITALS — BP 101/68 | HR 97 | Ht 62.0 in | Wt 130.0 lb

## 2024-03-14 DIAGNOSIS — G20B1 Parkinson's disease with dyskinesia, without mention of fluctuations: Secondary | ICD-10-CM | POA: Diagnosis not present

## 2024-03-14 NOTE — Progress Notes (Signed)
 Subjective:    Patient ID: Breanna Powers is a 68 y.o. female.  HPI    Interim history:   Breanna Powers is a 68 year old right-handed female with an underlying medical history of knee pain, eczema, status post right shoulder arthroscopic surgery in 2018, who presents for follow-up consultation of her Parkinson's disease of several years' duration.  The patient is accompanied by her husband today.  I last saw her in November 2024, at which time she was advised to continue with her medication regimen including Sinemet  generic 4 times a day as well as amantadine  and pramipexole.  She was encouraged to add Sinemet  CR at bedtime.  Today, 03/14/2024: She reports holding up fairly well.  She is finishing chemotherapy, is going to have her fourth cycle next week and then will start radiation therapy in June.  She was diagnosed with breast cancer in January 2025.  She had a lumpectomy on the left.  She is currently no longer on Sinemet  CR, she ran out of it about 3 days ago and has not noticed any repercussions, would like to see if she can do without it.  She continues to take amantadine  100 mg twice daily, pramipexole 0.5 mg 4 times daily, and Sinemet  IR generic 1 pill 4 times a day.  She tries to hydrate well with water.  She has not had any recent falls.  No significant constipation.  Previously:   09/07/2023: She reports feeling fairly stable, sometimes first thing in the morning she feels off.  She is wondering if we can modify her medication regimen.  She generally takes her Sinemet  IR 5 hourly starting at 7 AM.  She takes it 4 times a day with the last dose at 10 PM.  Bedtime is generally between 10:30 PM and 11 PM.  She has never tried Sinemet  CR.  She continues to take amantadine  twice daily, usually at 7 AM and 5 PM.  She takes pramipexole 0.5 mg 4 times a day, at the same times as the levodopa .  Thankfully, she has not fallen, she has tripped but no serious balance issues.  She has occasional  swallowing difficulty but no choking events.  She tries to hydrate well.  She drinks caffeine in the form of coffee in the morning.  She tries to go to the gym at least 2 times a week and works on [employer] as well as yoga and cycling.  She has had no cognitive concerns.  She drives without problems.  She does not like to drive in the dark.   I first met her at the request of Dr. Burnice Carton, at which time she requested to transfer care.  She was diagnosed with Parkinson's disease in 2019 and was on levodopa  therapy at the time as well as pramipexole and amantadine .   03/02/23: (She) reports a several year history of incoordination and weakness on the right side as well as intermittent tremors.  She was diagnosed with Parkinson's disease in 2019.  She had seen Dr. Tresia Fruit in our office in 2017 and had seen Dr. Merced Stair in November 2018.  She had an EMG and nerve conduction velocity test through [facility] under Dr. Blain Bulls in February 2019 with normal findings.    Her symptoms date back to 2017 when she developed intermittent tremors and slowness and stiffness affecting primarily her right upper extremity.  She has been on Sinemet  and pramipexole for several years.  She was started on amantadine  in November 2020.  She has  been taking stable doses of amantadine  100 mg twice daily, pramipexole 0.5 mg 4 times daily, and generic Sinemet  25-100 mg strength 1 pill 4 times daily for the past few years.  She reports good results with her medications.   She was started on pramipexole in July 2019 when she saw Dr. Merced Stair.  She started Sinemet  in June 2019.   I reviewed multiple records in her chart, she was last seen at [facility] neurology on 09/09/2022 by Caroline Cinnamon, PA.  She was advised to continue her medication regimen.  She requested a transfer of care as she retired and no longer works in [place].   She had a brain MRI without contrast through [facility] on 09/16/2017 and I reviewed  the results: Impression: Normal MRI of the brain without contrast.  Incidental note of pansinusitis without air-fluid level.   She fell in October 2023 but did not have any head injury or loss of consciousness, no major injuries thankfully.  She has no significant constipation, no significant cognitive complaints.  She exercises regularly, 3 to 4 times a week at the gym, she attends weightlifting and yoga and spinning classes.  She takes her amantadine  at 7 AM and 5 PM, she takes her Sinemet  and pramipexole at 7, 12, 5 PM and 10 PM daily.  She drives fairly well but tries to avoid bad weather or nighttime driving.  Her dad had Parkinson's disease and lived to be 69.  She is the middle of 5 girls altogether, 1 sister passed away from breast cancer, her younger sibling is a brother, none of her siblings have parkinsonism.  She lives with her husband, he has no children, they have 3 dogs in the household.  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  She drinks quite a bit of caffeine in the form of coffee, about 4 cups/day and occasional green tea.  She drinks alcohol about 3-5 times a week, up to 2 drinks at a time.  Her Past Medical History Is Significant For: Past Medical History:  Diagnosis Date   Dermatitis    Eczema    Fibroid    Hemiparesis (HCC)    right side "part of the parkinson's" also had a muscle tear repair in R shoulder   Hypertension    Knee pain    Menopause    Parkinson disease (HCC)     Her Past Surgical History Is Significant For: Past Surgical History:  Procedure Laterality Date   BREAST BIOPSY Left 11/14/2023   US  LT BREAST BX W LOC DEV 1ST LESION IMG BX SPEC US  GUIDE 11/14/2023 [facility]   BREAST BIOPSY Left 12/06/2023   US  LT RADIOACTIVE SEED LOC 12/06/2023 [facility]   BREAST LUMPECTOMY WITH RADIOACTIVE SEED AND SENTINEL LYMPH NODE BIOPSY Left 12/07/2023   Procedure: LEFT SEED LUMPECTOMY AND SENTINEL LYMPH  NODE BIOPSY;  Surgeon: Lockie Rima, MD;  Location: [facility];  Service: General;  Laterality: Left;  GEN w/REGIONAL   COLONOSCOPY     2021   CYSTOSCOPY     10/2019   SHOULDER ARTHROSCOPY Right    2018 W biceps tenodesis   TOTAL ABDOMINAL HYSTERECTOMY     10-29-2019   TRIGGER FINGER RELEASE Left    09/29/2020   tummy tuck     04-2023   WISDOM TOOTH EXTRACTION      Her Family History Is Significant For: Family History  Problem Relation Age of Onset  Neuropathy Mother    Dementia Mother    Depression Mother    Hypertension Mother    Restless legs syndrome Mother    Parkinson's disease Father    Heart disease Father    Breast cancer Sister 68       d. 90   Atrial fibrillation Sister    Osteoporosis Sister    Parkinson's disease Sister    Heart disease Sister    Cancer Maternal Aunt        unk type; ? pancreatic; mets dx >50   Breast cancer Paternal Aunt        dx >50   Breast cancer Paternal Grandmother        mets; d. 23s   Heart disease Paternal Grandfather     Her Social History Is Significant For: Social History   Socioeconomic History   Marital status: Married    Spouse name: Not on file   Number of children: 0   Years of education: Assoc   Highest education level: Not on file  Occupational History   Occupation: [employer]  Tobacco Use   Smoking status: Never   Smokeless tobacco: Never  Vaping Use   Vaping status: Never Used  Substance and Sexual Activity   Alcohol use: Yes    Alcohol/week: 2.0 standard drinks of alcohol    Types: 2 Shots of liquor per week   Drug use: Never   Sexual activity: Not on file    Comment: TAH  Other Topics Concern   Not on file  Social History Narrative   Patient drinks 3-4 cups of coffee a day    Associates degree   Right handed   Goes to the gym for exercise 3-4 times per week   Lives with husband    Retired    Chief Executive Officer Drivers of Corporate investment banker Strain: Low Risk  (03/08/2023)    Received from [facility], [facility]   Overall Financial Resource Strain (CARDIA)    Difficulty of Paying Living Expenses: Not hard at all  Food Insecurity: No Food Insecurity (11/22/2023)   Hunger Vital Sign    Worried About Running Out of Food in the Last Year: Never true    Ran Out of Food in the Last Year: Never true  Transportation Needs: No Transportation Needs (11/22/2023)   PRAPARE - Administrator, Civil Service (Medical): No    Lack of Transportation (Non-Medical): No  Physical Activity: Insufficiently Active (08/10/2021)   Received from [facility] visits prior to 12/24/2022., [facility] visits prior to 12/24/2022.   Exercise Vital Sign    Days of Exercise per Week: 1 day    Minutes of Exercise per Session: 60 min  Stress: Stress Concern Present (08/10/2021)   Received from [facility] visits prior to 12/24/2022., [facility] visits prior to 12/24/2022.   [employer] of Occupational Health - Occupational Stress Questionnaire    Feeling of Stress : Rather much  Social Connections: Unknown (02/07/2023)   Received from [facility], [facility]   Social Network    Social Network: Not on file    Her Allergies Are:  No Known Allergies:   Her Current Medications Are:  Outpatient Encounter Medications as of 03/14/2024  Medication Sig   amantadine  (SYMMETREL ) 100 MG capsule Take 1 capsule (100 mg total) by mouth 2 (two) times daily.   carbidopa -levodopa  (SINEMET  CR) 50-200 MG tablet Take  1 tablet by mouth at bedtime.   carbidopa -levodopa  (SINEMET  IR) 25-100 MG tablet TAKE 1 TABLET FOUR TIMES A DAY   dexamethasone  (DECADRON ) 4 MG tablet Take 2 tabs by mouth 2 times daily starting day before chemo. Then take 2 tabs daily for 2 days starting day after chemo. Take with food.   lidocaine -prilocaine  (EMLA ) cream Apply to affected area once   losartan (COZAAR) 25 MG tablet Take 25  mg by mouth daily.   Multiple Vitamin (MULTIVITAMIN) tablet Take by mouth.   ondansetron  (ZOFRAN ) 8 MG tablet Take 1 tablet (8 mg total) by mouth every 8 (eight) hours as needed for nausea or vomiting. Start on the third day after chemotherapy.   pramipexole (MIRAPEX) 0.5 MG tablet TAKE 1 TABLET 3 TO 4 TIMES DAILY   prochlorperazine  (COMPAZINE ) 10 MG tablet Take 1 tablet (10 mg total) by mouth every 6 (six) hours as needed for nausea or vomiting.   Calcium Carb-Cholecalciferol (CALCIUM 600/VITAMIN D3) 600-20 MG-MCG TABS Take by mouth 2 (two) times daily.   Cholecalciferol (VITAMIN D3) 1000 units CAPS Take 2,000 Units by mouth.   vitamin B-12 (CYANOCOBALAMIN) 500 MCG tablet Take 500 mcg by mouth daily.   No facility-administered encounter medications on file as of 03/14/2024.  :  Review of Systems:  Out of a complete 14 point review of systems, all are reviewed and negative with the exception of these symptoms as listed below:  Review of Systems  Neurological:        Pt here for PD f/u Pt states started chemo treatment for breast cancer Pt and husband states right arm twitches at night     Objective:  Neurological Exam  Physical Exam Physical Examination:   Vitals:   03/14/24 1336  BP: 101/68  Pulse: 97    General Examination: The patient is a very pleasant 68 y.o. female in no acute distress. She appears well-developed and well-nourished and well groomed.   HEENT: Normocephalic, atraumatic, pupils are equal, round and reactive to light, extraocular tracking is good without limitation to gaze excursion or nystagmus noted. Hearing is grossly intact. Face is symmetric with very mild facial masking and intermittent upper body dyskinesias, speech is clear with slight raspiness and very mild hypophonia but otherwise normal, no dysarthria, no voice tremor.  She has a very mild nuchal rigidity, good range of motion, no carotid bruits.  Airway examination reveals mild mouth dryness, otherwise  nonfocal findings.  Tongue protrudes centrally and palate elevates symmetrically.     Chest: Clear to auscultation without wheezing, rhonchi or crackles noted.   Heart: S1+S2+0, regular and normal without murmurs, rubs or gallops noted.    Abdomen: Soft, non-tender and non-distended.   Extremities: There is no pitting edema in the distal lower extremities bilaterally.    Skin: Warm and dry without trophic changes noted.    Musculoskeletal: exam reveals no obvious joint deformities.    Neurologically:  Mental status: The patient is awake, alert and oriented in all 4 spheres. Her immediate and remote memory, attention, language skills and fund of knowledge are good.  No evidence of any significant bradyphrenia, aphasia, agnosia, apraxia or anomia. Speech as above. Thought process is linear. Mood is normal and affect is normal.  Cranial nerves II - XII are as described above under HEENT exam.  Motor exam: Normal bulk, and strength noted, slight increase in tone in the right upper extremity, no obvious resting or postural or action tremor.   Mild intermittent generalized dyskinesias noted. Fine  motor skills and coordination: Mild to moderately impaired with finger taps, hand movements and foot taps in the right upper extremity in the right lower extremity, better on the left altogether.    Cerebellar testing: No dysmetria or intention tremor. There is no truncal or gait ataxia.  Sensory exam: intact to light touch in the upper and lower extremities.  Gait, station and balance: She stands without differential occultly, posture is age-appropriate, mild decrease in arm swing on the right, otherwise no shuffling, mild dyskinesias while walking as well.  No sustained dystonic posturing.  No walking aid.   Assessment and Plan:  In summary, Allysa Governale is a very pleasant 68 year old female with an underlying medical history of knee pain, eczema, status post right shoulder arthroscopic surgery in  2018, breast cancer with status postlumpectomy in February 2025 and currently in chemotherapy with plan to start XRT in June 2025, who presents for follow-up consultation of her right sided parkinsonism with symptoms dating back to 2017 and diagnosis in 2019.  She has been fairly stable.  She takes levodopa  1 pill 4 times a day and has been on amantadine  100 mg twice daily as well as pramipexole 0.5 milligrams 4 times daily.  We started Sinemet  CR in November 2024 but she is currently not on it and would like to stay off of it for now.  We can certainly restart it if she feels that she did have benefit from it and would like to reinitiate it.  I would be happy to send in a prescription.  She is advised to continue to stay well-hydrated and well rested.  She is advised to follow-up routinely in this clinic to see one of our nurse practitioners in 6 months, we mutually agreed to keep her medication regimen the same.  We talked about the importance of maintaining the lifestyle and fall prevention.  She does not have any significant cognitive complaints at this time and no significant constipation.  I answered all their questions today and the patient husband were in agreement.   I spent 30 minutes in total face-to-face time and in reviewing records during pre-charting, more than 50% of which was spent in counseling and coordination of care, reviewing test results, reviewing medications and treatment regimen and/or in discussing or reviewing the diagnosis of PD, the prognosis and treatment options. Pertinent laboratory and imaging test results that were available during this visit with the patient were reviewed by me and considered in my medical decision making (see chart for details).

## 2024-03-15 ENCOUNTER — Other Ambulatory Visit: Payer: Self-pay

## 2024-03-15 ENCOUNTER — Telehealth: Payer: Self-pay

## 2024-03-15 NOTE — Telephone Encounter (Signed)
Pt verbally confirmed appt

## 2024-03-18 ENCOUNTER — Encounter: Payer: Self-pay | Admitting: Radiation Oncology

## 2024-03-19 ENCOUNTER — Inpatient Hospital Stay

## 2024-03-19 ENCOUNTER — Encounter: Payer: Self-pay | Admitting: *Deleted

## 2024-03-19 ENCOUNTER — Inpatient Hospital Stay (HOSPITAL_BASED_OUTPATIENT_CLINIC_OR_DEPARTMENT_OTHER): Admitting: Hematology and Oncology

## 2024-03-19 VITALS — BP 119/60 | HR 92 | Temp 99.6°F | Resp 17 | Wt 129.7 lb

## 2024-03-19 DIAGNOSIS — Z1732 Human epidermal growth factor receptor 2 negative status: Secondary | ICD-10-CM | POA: Diagnosis not present

## 2024-03-19 DIAGNOSIS — Z1721 Progesterone receptor positive status: Secondary | ICD-10-CM | POA: Diagnosis not present

## 2024-03-19 DIAGNOSIS — E875 Hyperkalemia: Secondary | ICD-10-CM | POA: Diagnosis not present

## 2024-03-19 DIAGNOSIS — Z803 Family history of malignant neoplasm of breast: Secondary | ICD-10-CM

## 2024-03-19 DIAGNOSIS — Z170 Estrogen receptor positive status [ER+]: Secondary | ICD-10-CM | POA: Diagnosis not present

## 2024-03-19 DIAGNOSIS — Z5111 Encounter for antineoplastic chemotherapy: Secondary | ICD-10-CM | POA: Diagnosis not present

## 2024-03-19 DIAGNOSIS — D649 Anemia, unspecified: Secondary | ICD-10-CM

## 2024-03-19 DIAGNOSIS — T451X5A Adverse effect of antineoplastic and immunosuppressive drugs, initial encounter: Secondary | ICD-10-CM | POA: Diagnosis not present

## 2024-03-19 DIAGNOSIS — C50212 Malignant neoplasm of upper-inner quadrant of left female breast: Secondary | ICD-10-CM

## 2024-03-19 DIAGNOSIS — G20A1 Parkinson's disease without dyskinesia, without mention of fluctuations: Secondary | ICD-10-CM | POA: Diagnosis not present

## 2024-03-19 DIAGNOSIS — R5383 Other fatigue: Secondary | ICD-10-CM | POA: Diagnosis not present

## 2024-03-19 DIAGNOSIS — Z5189 Encounter for other specified aftercare: Secondary | ICD-10-CM | POA: Diagnosis not present

## 2024-03-19 LAB — CMP (CANCER CENTER ONLY)
ALT: 10 U/L (ref 0–44)
AST: 13 U/L — ABNORMAL LOW (ref 15–41)
Albumin: 4.3 g/dL (ref 3.5–5.0)
Alkaline Phosphatase: 108 U/L (ref 38–126)
Anion gap: 7 (ref 5–15)
BUN: 14 mg/dL (ref 8–23)
CO2: 25 mmol/L (ref 22–32)
Calcium: 9.9 mg/dL (ref 8.9–10.3)
Chloride: 107 mmol/L (ref 98–111)
Creatinine: 0.81 mg/dL (ref 0.44–1.00)
GFR, Estimated: >60 mL/min (ref >60)
Glucose, Bld: 123 mg/dL — ABNORMAL HIGH (ref 70–99)
Potassium: 4.2 mmol/L (ref 3.5–5.1)
Sodium: 139 mmol/L (ref 135–145)
Total Bilirubin: 0.6 mg/dL (ref 0.0–1.2)
Total Protein: 7 g/dL (ref 6.5–8.1)

## 2024-03-19 LAB — CBC WITH DIFFERENTIAL (CANCER CENTER ONLY)
Abs Immature Granulocytes: 0.02 10*3/uL (ref 0.00–0.07)
Basophils Absolute: 0 10*3/uL (ref 0.0–0.1)
Basophils Relative: 0 %
Eosinophils Absolute: 0 10*3/uL (ref 0.0–0.5)
Eosinophils Relative: 0 %
HCT: 34.3 % — ABNORMAL LOW (ref 36.0–46.0)
Hemoglobin: 11.8 g/dL — ABNORMAL LOW (ref 12.0–15.0)
Immature Granulocytes: 0 %
Lymphocytes Relative: 14 %
Lymphs Abs: 0.9 10*3/uL (ref 0.7–4.0)
MCH: 31.8 pg (ref 26.0–34.0)
MCHC: 34.4 g/dL (ref 30.0–36.0)
MCV: 92.5 fL (ref 80.0–100.0)
Monocytes Absolute: 0.4 10*3/uL (ref 0.1–1.0)
Monocytes Relative: 6 %
Neutro Abs: 5.4 10*3/uL (ref 1.7–7.7)
Neutrophils Relative %: 80 %
Platelet Count: 327 10*3/uL (ref 150–400)
RBC: 3.71 MIL/uL — ABNORMAL LOW (ref 3.87–5.11)
RDW: 15.5 % (ref 11.5–15.5)
WBC Count: 6.8 10*3/uL (ref 4.0–10.5)
nRBC: 0 % (ref 0.0–0.2)

## 2024-03-19 MED ORDER — SODIUM CHLORIDE 0.9 % IV SOLN
INTRAVENOUS | Status: DC
Start: 2024-03-19 — End: 2024-03-19

## 2024-03-19 MED ORDER — SODIUM CHLORIDE 0.9 % IV SOLN
600.0000 mg/m2 | Freq: Once | INTRAVENOUS | Status: AC
  Administered 2024-03-19: 1000 mg via INTRAVENOUS
  Filled 2024-03-19: qty 50

## 2024-03-19 MED ORDER — DEXAMETHASONE SODIUM PHOSPHATE 10 MG/ML IJ SOLN
10.0000 mg | Freq: Once | INTRAMUSCULAR | Status: AC
  Administered 2024-03-19: 10 mg via INTRAVENOUS
  Filled 2024-03-19: qty 1

## 2024-03-19 MED ORDER — SODIUM CHLORIDE 0.9 % IV SOLN
75.0000 mg/m2 | Freq: Once | INTRAVENOUS | Status: AC
  Administered 2024-03-19: 120 mg via INTRAVENOUS
  Filled 2024-03-19: qty 12

## 2024-03-19 MED ORDER — PALONOSETRON HCL INJECTION 0.25 MG/5ML
0.2500 mg | Freq: Once | INTRAVENOUS | Status: AC
  Administered 2024-03-19: 0.25 mg via INTRAVENOUS
  Filled 2024-03-19: qty 5

## 2024-03-19 NOTE — Assessment & Plan Note (Signed)
 Assessment and Plan Assessment & Plan Breast cancer, undergoing treatment Receiving chemotherapy cycle 4 today, scheduled for radiation oncology appt on June 17th. Post-radiation pill treatment planned. Discussed side effects and alternatives, including tamoxifen vs aromatase inhibitors.. Engaged in shared decision-making. Chemotherapy well-tolerated. - Proceed with radiation on June 17th. - Initiate anti estrogen therapy post radiation.  Chemotherapy-induced fatigue Experiencing cumulative fatigue lasting 10-14 days post-chemotherapy. No other significant side effects.  Mild anemia Very mild anemia noted, no intervention required. Awaiting metabolic panel results.  Parkinson's disease Well-managed with no recent changes in symptoms or medication.

## 2024-03-19 NOTE — Progress Notes (Signed)
 [facility] Cancer Follow up:    Breanna Elders, PA-C [address] [place] [address]   DIAGNOSIS:  Cancer Staging  Malignant neoplasm of upper-inner quadrant of left breast in female, estrogen receptor positive (HCC) Staging form: Breast, AJCC 8th Edition - Pathologic stage from 11/22/2023: Stage IA (pT1a, pN0, cM0, G2, ER+, PR+, HER2-) - Signed by Murleen Arms, MD on 11/22/2023 Stage prefix: Initial diagnosis Histologic grading system: 3 grade system Laterality: Left Stage used in treatment planning: Yes National guidelines used in treatment planning: Yes Type of national guideline used in treatment planning: NCCN    SUMMARY OF ONCOLOGIC HISTORY: Oncology History  Malignant neoplasm of upper-inner quadrant of left breast in female, estrogen receptor positive (HCC)  10/09/2023 Mammogram   Mammogram showed possible asymmetry in the right breast and left breast hence diagnostic mammogram and ultrasound were recommended.  Diagnostic mammogram once again confirmed a 5 x 4 x 4 mm irregular hypoechoic mass left breast at 9 o'clock position 6 cm from the nipple.  No left axillary adenopathy.  Questioned asymmetry in the right breast resolved with additional imaging compatible with dense overlapping fibroglandular tissue.   11/14/2023 Pathology Results   Left breast needle core biopsy at 9:00 showed invasive ductal carcinoma, overall grade 2, prognostics showed ER 95% positive strong staining PR 60% positive weak to moderate staining, Ki-67 of 10% and HER2 1+.   11/20/2023 Initial Diagnosis   Malignant neoplasm of upper-inner quadrant of left breast in female, estrogen receptor positive (HCC)   11/22/2023 Cancer Staging   Staging form: Breast, AJCC 8th Edition - Pathologic stage from 11/22/2023: Stage IA (pT1a, pN0, cM0, G2, ER+, PR+, HER2-) - Signed by Murleen Arms, MD on 11/22/2023 Stage prefix: Initial diagnosis Histologic grading system: 3 grade  system Laterality: Left Stage used in treatment planning: Yes National guidelines used in treatment planning: Yes Type of national guideline used in treatment planning: NCCN   11/30/2023 Genetic Testing   Negative [facility] CancerNext+RNAinsight Panel.  Report date is 11/30/2023.  The [facility] CancerNext+RNAinsight Panel includes sequencing, rearrangement analysis, and RNA analysis for the following 39 genes: APC, ATM, BAP1, BARD1, BMPR1A, BRCA1, BRCA2, BRIP1, CDH1, CDKN2A, CHEK2, FH, FLCN, MET, MLH1, MSH2, MSH6, MUTYH, NF1, NTHL1, PALB2, PMS2, PTEN, RAD51C, RAD51D, SMAD4, STK11, TP53, TSC1, TSC2, and VHL (sequencing and deletion/duplication); AXIN2, HOXB13, MBD4, MSH3, POLD1 and POLE (sequencing only); EPCAM and GREM1 (deletion/duplication only).    12/07/2023 Pathology Results   Grade 2 IDC, Post margin pos, ER: 95%, positive, strong staining intensity  PR: 60%, positive, weak-moderate intensity  Her2: 1+, negative  Ki-67: 10%  LN negative   12/20/2023 Oncotype testing   Oncotype DX of 27, distant recurrence risk at 9 yrs of 16%, group average absolute chemo benefit greater than 15%   01/16/2024 -  Chemotherapy   Patient is on Treatment Plan : BREAST TC q21d       CURRENT THERAPY:   TC INTERVAL HISTORY:  Breanna Powers 68 y.o. female returns for follow-up and evaluation prior to receiving her fourth cycle of TC.  Discussed the use of AI scribe software for clinical note transcription with the patient, who gave verbal consent to proceed.  History of Present Illness Breanna Powers is a 68 year old female with cancer undergoing chemotherapy who presents for follow-up.  She experiences weakness and tiredness lasting approximately ten days, which is longer than the previous duration of about a week. No nausea, vomiting, tingling, numbness, or pain in her hands  or feet. Her appetite is better than okay, though she acknowledges needing healthier food choices. No changes in breathing, and bowel  movements and urination are normal. Blood counts show very mild anemia.  She is also managing Parkinson's disease, which remains stable with no recent changes in medication. She experiences occasional shakiness, a long-standing issue.   Patient Active Problem List   Diagnosis Date Noted   Genetic testing 11/30/2023   Malignant neoplasm of upper-inner quadrant of left breast in female, estrogen receptor positive (HCC) 11/20/2023   Hemiparesis (HCC) 06/30/2016   KNEE PAIN 09/16/2008   ITBS, RIGHT KNEE 09/16/2008   UNEQUAL LEG LENGTH 09/16/2008    has no known allergies.  MEDICAL HISTORY: Past Medical History:  Diagnosis Date   Dermatitis    Eczema    Fibroid    Hemiparesis (HCC)    right side "part of the parkinson's" also had a muscle tear repair in R shoulder   Hypertension    Knee pain    Menopause    Parkinson disease (HCC)     SURGICAL HISTORY: Past Surgical History:  Procedure Laterality Date   BREAST BIOPSY Left 11/14/2023   US  LT BREAST BX W LOC DEV 1ST LESION IMG BX SPEC US  GUIDE 11/14/2023 [facility] MAMMOGRAPHY   BREAST BIOPSY Left 12/06/2023   US  LT RADIOACTIVE SEED LOC 12/06/2023 [facility] MAMMOGRAPHY   BREAST LUMPECTOMY WITH RADIOACTIVE SEED AND SENTINEL LYMPH NODE BIOPSY Left 12/07/2023   Procedure: LEFT SEED LUMPECTOMY AND SENTINEL LYMPH NODE BIOPSY;  Surgeon: Lockie Rima, MD;  Location: [facility];  Service: General;  Laterality: Left;  GEN w/REGIONAL   COLONOSCOPY     2021   CYSTOSCOPY     10/2019   SHOULDER ARTHROSCOPY Right    2018 W biceps tenodesis   TOTAL ABDOMINAL HYSTERECTOMY     10-29-2019   TRIGGER FINGER RELEASE Left    09/29/2020   tummy tuck     04-2023   WISDOM TOOTH EXTRACTION      SOCIAL HISTORY: Social History   Socioeconomic History   Marital status: Married    Spouse name: Not on file   Number of children: 0   Years of education: Assoc   Highest education level: Not on file  Occupational History   Occupation: [employer]  Tobacco Use   Smoking status: Never   Smokeless tobacco: Never  Vaping Use   Vaping status: Never Used  Substance and Sexual Activity   Alcohol use: Yes    Alcohol/week: 2.0 standard drinks of alcohol    Types: 2 Shots of liquor per week   Drug use: Never   Sexual activity: Not on file    Comment: TAH  Other Topics Concern   Not on file  Social History Narrative   Patient drinks 3-4 cups of coffee a day    Associates degree   Right handed   Goes to the gym for exercise 3-4 times per week   Lives with husband    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: Low Risk  (03/08/2023)   Received from [employer], [facility]   Overall Financial Resource Strain (CARDIA)    Difficulty of Paying Living Expenses: Not hard at all  Food Insecurity: No Food Insecurity (11/22/2023)   Hunger Vital Sign    Worried About Running Out of Food in the Last Year: Never true    Ran Out of Food in the Last Year: Never true  Transportation Needs: No Transportation Needs (11/22/2023)   PRAPARE - Administrator, Civil Service (Medical): No    Lack of Transportation (Non-Medical): No  Physical Activity: Insufficiently Active (08/10/2021)   Received from [facility] visits prior to 12/24/2022., [facility] visits prior to 12/24/2022.   Exercise Vital Sign    Days of Exercise per Week: 1 day    Minutes of Exercise per Session: 60 min  Stress: Stress Concern Present (08/10/2021)   Received from [facility] visits prior to 12/24/2022., [facility] visits prior to 12/24/2022.   [employer] of Occupational Health - Occupational Stress Questionnaire    Feeling of Stress : Rather much  Social Connections: Unknown (02/07/2023)   Received from [facility], [facility]   Social Network    Social Network: Not on file  Intimate Partner Violence: Not At Risk (11/22/2023)    Humiliation, Afraid, Rape, and Kick questionnaire    Fear of Current or Ex-Partner: No    Emotionally Abused: No    Physically Abused: No    Sexually Abused: No    FAMILY HISTORY: Family History  Problem Relation Age of Onset   Neuropathy Mother    Dementia Mother    Depression Mother    Hypertension Mother    Restless legs syndrome Mother    Parkinson's disease Father    Heart disease Father    Breast cancer Sister 8       d. 30   Atrial fibrillation Sister    Osteoporosis Sister    Parkinson's disease Sister    Heart disease Sister    Cancer Maternal Aunt        unk type; ? pancreatic; mets dx >50   Breast cancer Paternal Aunt        dx >50   Breast cancer Paternal Grandmother        mets; d. 69s   Heart disease Paternal Grandfather     Review of Systems  Constitutional:  Negative for appetite change, chills, fatigue, fever and unexpected weight change.  HENT:   Negative for hearing loss, lump/mass and trouble swallowing.   Eyes:  Negative for eye problems and icterus.  Respiratory:  Negative for chest tightness, cough and shortness of breath.   Cardiovascular:  Negative for chest pain, leg swelling and palpitations.  Gastrointestinal:  Negative for abdominal distention, abdominal pain, constipation, diarrhea, nausea and vomiting.  Endocrine: Negative for hot flashes.  Genitourinary:  Negative for difficulty urinating.   Musculoskeletal:  Negative for arthralgias.  Skin:  Negative for itching and rash.  Neurological:  Negative for dizziness, extremity weakness, headaches and numbness.  Hematological:  Negative for adenopathy. Does not bruise/bleed easily.  Psychiatric/Behavioral:  Negative for depression. The patient is not nervous/anxious.       PHYSICAL EXAMINATION    Vitals:   03/19/24 0912  BP: 119/60  Pulse: 92  Resp: 17  Temp: 99.6 F (37.6 C)  SpO2: 100%    Physical Exam Constitutional:      General: She is not in acute distress.     Appearance: Normal appearance. She is not toxic-appearing.  HENT:     Head: Normocephalic and atraumatic.     Mouth/Throat:     Mouth: Mucous membranes are moist.     Pharynx: Oropharynx is clear. No oropharyngeal exudate or posterior oropharyngeal erythema.  Eyes:     General: No scleral icterus. Cardiovascular:  Rate and Rhythm: Normal rate and regular rhythm.     Pulses: Normal pulses.     Heart sounds: Normal heart sounds.  Pulmonary:     Effort: Pulmonary effort is normal.     Breath sounds: Normal breath sounds.  Abdominal:     General: Abdomen is flat. Bowel sounds are normal. There is no distension.     Palpations: Abdomen is soft.     Tenderness: There is no abdominal tenderness.  Musculoskeletal:        General: No swelling.     Cervical back: Neck supple.  Lymphadenopathy:     Cervical: No cervical adenopathy.  Skin:    General: Skin is warm and dry.     Findings: No rash.  Neurological:     General: No focal deficit present.     Mental Status: She is alert.  Psychiatric:        Mood and Affect: Mood normal.        Behavior: Behavior normal.     LABORATORY DATA:  CBC    Component Value Date/Time   WBC 6.8 03/19/2024 0847   RBC 3.71 (L) 03/19/2024 0847   HGB 11.8 (L) 03/19/2024 0847   HCT 34.3 (L) 03/19/2024 0847   PLT 327 03/19/2024 0847   MCV 92.5 03/19/2024 0847   MCH 31.8 03/19/2024 0847   MCHC 34.4 03/19/2024 0847   RDW 15.5 03/19/2024 0847   LYMPHSABS 0.9 03/19/2024 0847   MONOABS 0.4 03/19/2024 0847   EOSABS 0.0 03/19/2024 0847   BASOSABS 0.0 03/19/2024 0847    CMP     Component Value Date/Time   NA 139 03/19/2024 0847   K 4.2 03/19/2024 0847   CL 107 03/19/2024 0847   CO2 25 03/19/2024 0847   GLUCOSE 123 (H) 03/19/2024 0847   BUN 14 03/19/2024 0847   CREATININE 0.81 03/19/2024 0847   CALCIUM 9.9 03/19/2024 0847   PROT 7.0 03/19/2024 0847   ALBUMIN 4.3 03/19/2024 0847   AST 13 (L) 03/19/2024 0847   ALT 10 03/19/2024 0847    ALKPHOS 108 03/19/2024 0847   BILITOT 0.6 03/19/2024 0847   GFRNONAA >60 03/19/2024 0847     ASSESSMENT and THERAPY PLAN:   Malignant neoplasm of upper-inner quadrant of left breast in female, estrogen receptor positive (HCC) Assessment and Plan Assessment & Plan Breast cancer, undergoing treatment Receiving chemotherapy cycle 4 today, scheduled for radiation oncology appt on June 17th. Post-radiation pill treatment planned. Discussed side effects and alternatives, including tamoxifen vs aromatase inhibitors.. Engaged in shared decision-making. Chemotherapy well-tolerated. - Proceed with radiation on June 17th. - Initiate anti estrogen therapy post radiation.  Chemotherapy-induced fatigue Experiencing cumulative fatigue lasting 10-14 days post-chemotherapy. No other significant side effects.  Mild anemia Very mild anemia noted, no intervention required. Awaiting metabolic panel results.  Parkinson's disease Well-managed with no recent changes in symptoms or medication.    All questions were answered. The patient knows to call the clinic with any problems, questions or concerns. We can certainly see the patient much sooner if necessary.  Total encounter time:20 minutes*in face-to-face visit time, chart review, lab review, care coordination, order entry, and documentation of the encounter time.  *Total Encounter Time as defined by the Centers for Medicare and Medicaid Services includes, in addition to the face-to-face time of a patient visit (documented in the note above) non-face-to-face time: obtaining and reviewing outside history, ordering and reviewing medications, tests or procedures, care coordination (communications with other health care professionals or caregivers) and  documentation in the medical record.

## 2024-03-19 NOTE — Patient Instructions (Signed)
 [facility] - A DEPT OF [facility]  Discharge Instructions: Thank you for choosing [facility] to provide your oncology and hematology care.   If you have a lab appointment with the Cancer Center, please go directly to the Cancer Center and check in at the registration area.   Wear comfortable clothing and clothing appropriate for easy access to any Portacath or PICC line.   We strive to give you quality time with your provider. You may need to reschedule your appointment if you arrive late (15 or more minutes).  Arriving late affects you and other patients whose appointments are after yours.  Also, if you miss three or more appointments without notifying the office, you may be dismissed from the clinic at the provider's discretion.      For prescription refill requests, have your pharmacy contact our office and allow 72 hours for refills to be completed.    Today you received the following chemotherapy and/or immunotherapy agents: Docetaxel, Cyclophosphamide      To help prevent nausea and vomiting after your treatment, we encourage you to take your nausea medication as directed.  BELOW ARE SYMPTOMS THAT SHOULD BE REPORTED IMMEDIATELY: *FEVER GREATER THAN 100.4 F (38 C) OR HIGHER *CHILLS OR SWEATING *NAUSEA AND VOMITING THAT IS NOT CONTROLLED WITH YOUR NAUSEA MEDICATION *UNUSUAL SHORTNESS OF BREATH *UNUSUAL BRUISING OR BLEEDING *URINARY PROBLEMS (pain or burning when urinating, or frequent urination) *BOWEL PROBLEMS (unusual diarrhea, constipation, pain near the anus) TENDERNESS IN MOUTH AND THROAT WITH OR WITHOUT PRESENCE OF ULCERS (sore throat, sores in mouth, or a toothache) UNUSUAL RASH, SWELLING OR PAIN  UNUSUAL VAGINAL DISCHARGE OR ITCHING   Items with * indicate a potential emergency and should be followed up as soon as possible or go to the Emergency Department if any problems should occur.  Please show the CHEMOTHERAPY ALERT CARD  or IMMUNOTHERAPY ALERT CARD at check-in to the Emergency Department and triage nurse.  Should you have questions after your visit or need to cancel or reschedule your appointment, please contact [facility] - A DEPT OF [facility]  Dept: [phone]  and follow the prompts.  Office hours are 8:00 a.m. to 4:30 p.m. Monday - Friday. Please note that voicemails left after 4:00 p.m. may not be returned until the following business day.  We are closed weekends and major holidays. You have access to a nurse at all times for urgent questions. Please call the main number to the clinic Dept: [phone] and follow the prompts.   For any non-urgent questions, you may also contact your provider using MyChart. We now offer e-Visits for anyone 15 and older to request care online for non-urgent symptoms. For details visit [email or web address].   Also download the MyChart app! Go to the app store, search "MyChart", open the app, select [facility], and log in with your MyChart username and password.

## 2024-03-21 ENCOUNTER — Inpatient Hospital Stay

## 2024-03-21 VITALS — BP 128/80 | HR 83 | Temp 98.6°F | Resp 16

## 2024-03-21 DIAGNOSIS — D649 Anemia, unspecified: Secondary | ICD-10-CM | POA: Diagnosis not present

## 2024-03-21 DIAGNOSIS — C50212 Malignant neoplasm of upper-inner quadrant of left female breast: Secondary | ICD-10-CM

## 2024-03-21 DIAGNOSIS — G20A1 Parkinson's disease without dyskinesia, without mention of fluctuations: Secondary | ICD-10-CM | POA: Diagnosis not present

## 2024-03-21 DIAGNOSIS — Z5189 Encounter for other specified aftercare: Secondary | ICD-10-CM | POA: Diagnosis not present

## 2024-03-21 DIAGNOSIS — Z5111 Encounter for antineoplastic chemotherapy: Secondary | ICD-10-CM | POA: Diagnosis not present

## 2024-03-21 DIAGNOSIS — R5383 Other fatigue: Secondary | ICD-10-CM | POA: Diagnosis not present

## 2024-03-21 DIAGNOSIS — Z170 Estrogen receptor positive status [ER+]: Secondary | ICD-10-CM | POA: Diagnosis not present

## 2024-03-21 DIAGNOSIS — E875 Hyperkalemia: Secondary | ICD-10-CM | POA: Diagnosis not present

## 2024-03-21 MED ORDER — PEGFILGRASTIM-JMDB 6 MG/0.6ML ~~LOC~~ SOSY
6.0000 mg | PREFILLED_SYRINGE | Freq: Once | SUBCUTANEOUS | Status: AC
  Administered 2024-03-21: 6 mg via SUBCUTANEOUS
  Filled 2024-03-21: qty 0.6

## 2024-03-26 ENCOUNTER — Telehealth: Payer: Self-pay

## 2024-03-26 DIAGNOSIS — Z170 Estrogen receptor positive status [ER+]: Secondary | ICD-10-CM

## 2024-03-26 NOTE — Telephone Encounter (Addendum)
 [id number]- Effectiveness of Out-of Pocket Cost Communication and Financial Navigation (CostCOM) in Cancer Patients:    Reached out to patient regarding 3 month Costcom Internal Counseling and Tail[employer]ed Referral ([facility]) via telephone. Patient says her insurance is the same. She's had an application for financial assistance program completed by [employer] in the last 3 months. We discussed [employer] has scheduled her next appt for June 10th at 10:45am est with a counselor. Dr. Loretha confirmed patient ECOG status is 0. Patient has my phone number if she has any questions.  Laury Quale, MPH  Clinical Research Coordinator

## 2024-03-29 NOTE — Progress Notes (Signed)
 Location of Breast Cancer: Malignant Neoplasm of Upper-Inner Quadrant of Left Breast, Estrogen Receptor Positive   Histology per Pathology Report:    Receptor Status: ER(95% Positive), PR (60%), Her2-neu (1+ Negative), Ki-67(10%)  Did patient present with symptoms (if so, please note symptoms) or was this found on screening mammography?:  10/09/2023 Mammogram Patient      Past/Anticipated interventions by surgeon, if any: 12/07/2023 Breanna Cornfield, MD Left Breast Localized Lumpectomy and Sentinel Lymph Node Biopsy   Past/Anticipated interventions by medical oncology, if any:  03/19/2024 Iruku, MD   Lymphedema issues, if any:  None.  Pain issues, if any:  Intermittent left breast pain    SAFETY ISSUES: Prior radiation? None Pacemaker/ICD? None Possible current pregnancy?N/A Is the patient on methotrexate? None  Current Complaints / other details:   None   BP 112/77 (BP Location: Right Arm, Patient Position: Sitting, Cuff Size: Normal)   Pulse 100   Temp 97.8 F (36.6 C)   Resp 18   Ht 5' 2 (1.575 m)   Wt 129 lb 9.6 oz (58.8 kg)   SpO2 99%   BMI 23.70 kg/m   Wt Readings from Last 3 Encounters:  04/09/24 129 lb 9.6 oz (58.8 kg)  03/19/24 129 lb 11.2 oz (58.8 kg)  03/14/24 130 lb (59 kg)

## 2024-04-03 DIAGNOSIS — Z170 Estrogen receptor positive status [ER+]: Secondary | ICD-10-CM | POA: Diagnosis not present

## 2024-04-03 DIAGNOSIS — C50212 Malignant neoplasm of upper-inner quadrant of left female breast: Secondary | ICD-10-CM | POA: Diagnosis not present

## 2024-04-08 NOTE — Progress Notes (Signed)
 Radiation Oncology         [phone] ________________________________  Name: Breanna Powers MRN: [id number]  Date: 04/09/2024  DOB: January 25, 1956  Follow-Up Visit Note  Outpatient  CC: Josette Nielsen, MD  Diagnosis:   No diagnosis found.  Malignant neoplasm of upper-inner quadrant of left breast in female, estrogen receptor positive (HCC)   CHIEF COMPLAINT: Here to discuss management of left breast cancer  Narrative:  The patient returns today to discuss radiation treatment options. She was seen in the multidisciplinary breast clinic on 11/22/23.  Since consultation, she underwent genetic testing on 11/30/23. Results showed no pathogenic mutations contributing to her recent diagnosis.  She opted to proceed with a left breast seed lumpectomy and sentinel lymph node biopsy on 12/07/23 under the care of Dr. Cherlynn Cornfield. Surgical pathology from the procedure revealed: tumor size of 0.95 cm. 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 Lymph nodes are negative for malignancy.     Oncotype DX was obtained on the final surgical sample and the recurrence score of 27 predicts a risk of recurrence outside the breast over the next 9 years of 16%, if the patient's only systemic therapy is an antiestrogen for 5 years.  It also predicts a significant benefit from chemotherapy.  Subsequently, chemotherapy consisting of  4 cycles of TC regimen was initiated on 01/17/24 under the care of Dr. Arno Bibles. They also opted to proceed with radiation and antiestrogen therapy post-chemotherapy. During most recent visit with Dr. Arno Bibles on 03/19/24, she reported experiencing fatigue induced by her treatment but otherwise she reported feeling well overall.      On review of systems, the patient reports ***. She denies *** and any other symptoms.   Symptomatically, the patient reports: ***        ALLERGIES:  has no known allergies.  Meds: Current Outpatient Medications  Medication Sig Dispense Refill   amantadine  (SYMMETREL ) 100 MG capsule Take 1 capsule (100 mg total) by mouth 2 (two) times daily. 180 capsule 0   Calcium Carb-Cholecalciferol (CALCIUM 600/VITAMIN D3) 600-20 MG-MCG TABS Take by mouth 2 (two) times daily.     carbidopa -levodopa  (SINEMET  CR) 50-200 MG tablet Take 1 tablet by mouth at bedtime. 30 tablet 5   carbidopa -levodopa  (SINEMET  IR) 25-100 MG tablet TAKE 1 TABLET FOUR TIMES A DAY 360 tablet 3   Cholecalciferol (VITAMIN D3) 1000 units CAPS Take 2,000 Units by mouth.     dexamethasone  (DECADRON ) 4 MG tablet Take 2 tabs by mouth 2 times daily starting day before chemo. Then take 2 tabs daily for 2 days starting day after chemo. Take with food. 30 tablet 1   lidocaine -prilocaine  (EMLA ) cream Apply to affected area once 30 g 3   losartan (COZAAR) 25 MG tablet Take 25 mg by mouth daily.     Multiple Vitamin (MULTIVITAMIN) tablet Take by mouth.     ondansetron  (ZOFRAN ) 8 MG tablet Take 1 tablet (8 mg total) by mouth every 8 (eight) hours as needed for nausea or vomiting. Start on the third day after chemotherapy. 30 tablet 1   pramipexole (MIRAPEX) 0.5 MG tablet TAKE 1 TABLET 3 TO 4 TIMES DAILY 360 tablet 3   prochlorperazine  (COMPAZINE ) 10 MG tablet Take 1 tablet (10 mg total) by mouth every 6 (six) hours as needed for nausea  or vomiting. 30 tablet 1   vitamin B-12 (CYANOCOBALAMIN) 500 MCG tablet Take 500 mcg by mouth daily.     No current facility-administered medications for this encounter.    Physical Findings:  vitals were not taken for this visit. .     General: Alert and oriented, in no acute distress HEENT: Head is normocephalic. Extraocular movements are intact. Oropharynx is clear. Neck: Neck is supple, no palpable  cervical or supraclavicular lymphadenopathy. Heart: Regular in rate and rhythm with no murmurs, rubs, or gallops. Chest: Clear to auscultation bilaterally, with no rhonchi, wheezes, or rales. Abdomen: Soft, nontender, nondistended, with no rigidity or guarding. Extremities: No cyanosis or edema. Lymphatics: see Neck Exam Musculoskeletal: symmetric strength and muscle tone throughout. Neurologic: No obvious focalities. Speech is fluent.  Psychiatric: Judgment and insight are intact. Affect is appropriate. Breast exam reveals ***  Lab Findings: Lab Results  Component Value Date   WBC 6.8 03/19/2024   HGB 11.8 (L) 03/19/2024   HCT 34.3 (L) 03/19/2024   MCV 92.5 03/19/2024   PLT 327 03/19/2024    @LASTCHEMISTRY @  Radiographic Findings: No results found.  Impression/Plan: We discussed adjuvant radiotherapy today.  I recommend *** in order to ***.  I reviewed the logistics, benefits, risks, and potential side effects of this treatment in detail. Risks may include but not necessary be limited to acute and late injury tissue in the radiation fields such as skin irritation (change in color/pigmentation, itching, dryness, pain, peeling). She may experience fatigue. We also discussed possible risk of long term cosmetic changes or scar tissue. There is also a smaller risk for lung toxicity, ***cardiac toxicity, ***brachial plexopathy, ***lymphedema, ***musculoskeletal changes, ***rib fragility or ***induction of a second malignancy, ***late chronic non-healing soft tissue wound.    The patient asked good questions which I answered to her satisfaction. She is enthusiastic about proceeding with treatment. A consent form has been *** signed and placed in her chart.  A total of *** medically necessary complex treatment devices will be fabricated and supervised by me: *** fields with MLCs for custom blocks to protect heart, and lungs;  and, a Vac-lok. MORE COMPLEX DEVICES MAY BE MADE IN DOSIMETRY FOR  FIELD IN FIELD BEAMS FOR DOSE HOMOGENEITY.  I have requested : 3D Simulation which is medically necessary to give adequate dose to at risk tissues while sparing lungs and heart.  I have requested a DVH of the following structures: lungs, heart, *** lumpectomy cavity.    The patient will receive *** Gy in *** fractions to the *** with *** fields.  This will be *** followed by a boost.  On date of service, in total, I spent *** minutes on this encounter. Patient was seen in person.  _____________________________________   Colie Dawes, MD  This document serves as a record of services personally performed by Colie Dawes, MD. It was created on her behalf by Lucky Sable, a trained medical scribe. The creation of this record is based on the scribe's personal observations and the provider's statements to them. This document has been checked and approved by the attending provider.

## 2024-04-09 ENCOUNTER — Ambulatory Visit
Admission: RE | Admit: 2024-04-09 | Discharge: 2024-04-09 | Disposition: A | Discharge status: Home / Self Care | Source: Ambulatory Visit | Attending: Radiation Oncology | Admitting: Radiation Oncology

## 2024-04-09 ENCOUNTER — Encounter: Payer: Self-pay | Admitting: Radiation Oncology

## 2024-04-09 VITALS — BP 112/77 | HR 100 | Temp 97.8°F | Resp 18 | Ht 62.0 in | Wt 129.6 lb

## 2024-04-09 DIAGNOSIS — C50212 Malignant neoplasm of upper-inner quadrant of left female breast: Secondary | ICD-10-CM | POA: Insufficient documentation

## 2024-04-09 DIAGNOSIS — Z7952 Long term (current) use of systemic steroids: Secondary | ICD-10-CM | POA: Insufficient documentation

## 2024-04-09 DIAGNOSIS — Z79899 Other long term (current) drug therapy: Secondary | ICD-10-CM | POA: Diagnosis not present

## 2024-04-09 DIAGNOSIS — Z170 Estrogen receptor positive status [ER+]: Secondary | ICD-10-CM | POA: Diagnosis not present

## 2024-04-09 DIAGNOSIS — Z510 Encounter for antineoplastic radiation therapy: Secondary | ICD-10-CM | POA: Diagnosis not present

## 2024-04-12 DIAGNOSIS — Z510 Encounter for antineoplastic radiation therapy: Secondary | ICD-10-CM | POA: Diagnosis not present

## 2024-04-12 DIAGNOSIS — Z170 Estrogen receptor positive status [ER+]: Secondary | ICD-10-CM | POA: Diagnosis not present

## 2024-04-12 DIAGNOSIS — C50212 Malignant neoplasm of upper-inner quadrant of left female breast: Secondary | ICD-10-CM | POA: Diagnosis not present

## 2024-04-15 ENCOUNTER — Encounter: Payer: Self-pay | Admitting: Hematology and Oncology

## 2024-04-16 ENCOUNTER — Other Ambulatory Visit: Payer: Self-pay

## 2024-04-16 ENCOUNTER — Ambulatory Visit
Admission: RE | Admit: 2024-04-16 | Discharge: 2024-04-16 | Disposition: A | Discharge status: Home / Self Care | Source: Ambulatory Visit | Attending: Radiation Oncology | Admitting: Radiation Oncology

## 2024-04-16 DIAGNOSIS — C50212 Malignant neoplasm of upper-inner quadrant of left female breast: Secondary | ICD-10-CM | POA: Diagnosis not present

## 2024-04-16 DIAGNOSIS — Z510 Encounter for antineoplastic radiation therapy: Secondary | ICD-10-CM | POA: Diagnosis not present

## 2024-04-16 DIAGNOSIS — Z170 Estrogen receptor positive status [ER+]: Secondary | ICD-10-CM | POA: Diagnosis not present

## 2024-04-16 LAB — RAD ONC ARIA SESSION SUMMARY
Course Elapsed Days: 0
Plan Fractions Treated to Date: 1
Plan Prescribed Dose Per Fraction: 2.67 Gy
Plan Total Fractions Prescribed: 15
Plan Total Prescribed Dose: 40.05 Gy
Reference Point Dosage Given to Date: 2.67 Gy
Reference Point Session Dosage Given: 2.67 Gy
Session Number: 1

## 2024-04-17 ENCOUNTER — Other Ambulatory Visit: Payer: PRIVATE HEALTH INSURANCE

## 2024-04-17 ENCOUNTER — Encounter: Payer: Self-pay | Admitting: *Deleted

## 2024-04-17 ENCOUNTER — Ambulatory Visit
Admission: RE | Admit: 2024-04-17 | Discharge: 2024-04-17 | Disposition: A | Discharge status: Home / Self Care | Source: Ambulatory Visit | Attending: Radiation Oncology | Admitting: Radiation Oncology

## 2024-04-17 ENCOUNTER — Other Ambulatory Visit: Payer: Self-pay

## 2024-04-17 DIAGNOSIS — Z170 Estrogen receptor positive status [ER+]: Secondary | ICD-10-CM | POA: Diagnosis not present

## 2024-04-17 DIAGNOSIS — C50212 Malignant neoplasm of upper-inner quadrant of left female breast: Secondary | ICD-10-CM | POA: Diagnosis not present

## 2024-04-17 DIAGNOSIS — Z510 Encounter for antineoplastic radiation therapy: Secondary | ICD-10-CM | POA: Diagnosis not present

## 2024-04-17 LAB — RAD ONC ARIA SESSION SUMMARY
Course Elapsed Days: 1
Plan Fractions Treated to Date: 2
Plan Prescribed Dose Per Fraction: 2.67 Gy
Plan Total Fractions Prescribed: 15
Plan Total Prescribed Dose: 40.05 Gy
Reference Point Dosage Given to Date: 5.34 Gy
Reference Point Session Dosage Given: 2.67 Gy
Session Number: 2

## 2024-04-18 ENCOUNTER — Other Ambulatory Visit: Payer: Self-pay

## 2024-04-18 ENCOUNTER — Ambulatory Visit
Admission: RE | Admit: 2024-04-18 | Discharge: 2024-04-18 | Disposition: A | Discharge status: Home / Self Care | Source: Ambulatory Visit | Attending: Radiation Oncology | Admitting: Radiation Oncology

## 2024-04-18 DIAGNOSIS — Z510 Encounter for antineoplastic radiation therapy: Secondary | ICD-10-CM | POA: Diagnosis not present

## 2024-04-18 DIAGNOSIS — Z170 Estrogen receptor positive status [ER+]: Secondary | ICD-10-CM | POA: Diagnosis not present

## 2024-04-18 DIAGNOSIS — C50212 Malignant neoplasm of upper-inner quadrant of left female breast: Secondary | ICD-10-CM | POA: Diagnosis not present

## 2024-04-18 LAB — RAD ONC ARIA SESSION SUMMARY
Course Elapsed Days: 2
Plan Fractions Treated to Date: 3
Plan Prescribed Dose Per Fraction: 2.67 Gy
Plan Total Fractions Prescribed: 15
Plan Total Prescribed Dose: 40.05 Gy
Reference Point Dosage Given to Date: 8.01 Gy
Reference Point Session Dosage Given: 2.67 Gy
Session Number: 3

## 2024-04-19 ENCOUNTER — Other Ambulatory Visit: Payer: Self-pay

## 2024-04-19 ENCOUNTER — Ambulatory Visit
Admission: RE | Admit: 2024-04-19 | Discharge: 2024-04-19 | Disposition: A | Discharge status: Home / Self Care | Source: Ambulatory Visit | Attending: Radiation Oncology

## 2024-04-19 DIAGNOSIS — Z510 Encounter for antineoplastic radiation therapy: Secondary | ICD-10-CM | POA: Diagnosis not present

## 2024-04-19 DIAGNOSIS — C50212 Malignant neoplasm of upper-inner quadrant of left female breast: Secondary | ICD-10-CM | POA: Diagnosis not present

## 2024-04-19 DIAGNOSIS — Z170 Estrogen receptor positive status [ER+]: Secondary | ICD-10-CM | POA: Diagnosis not present

## 2024-04-19 LAB — RAD ONC ARIA SESSION SUMMARY
Course Elapsed Days: 3
Plan Fractions Treated to Date: 4
Plan Prescribed Dose Per Fraction: 2.67 Gy
Plan Total Fractions Prescribed: 15
Plan Total Prescribed Dose: 40.05 Gy
Reference Point Dosage Given to Date: 10.68 Gy
Reference Point Session Dosage Given: 2.67 Gy
Session Number: 4

## 2024-04-22 ENCOUNTER — Ambulatory Visit
Admission: RE | Admit: 2024-04-22 | Discharge: 2024-04-22 | Disposition: A | Discharge status: Home / Self Care | Source: Ambulatory Visit | Attending: Radiation Oncology

## 2024-04-22 ENCOUNTER — Other Ambulatory Visit: Payer: Self-pay

## 2024-04-22 DIAGNOSIS — Z170 Estrogen receptor positive status [ER+]: Secondary | ICD-10-CM | POA: Diagnosis not present

## 2024-04-22 DIAGNOSIS — Z510 Encounter for antineoplastic radiation therapy: Secondary | ICD-10-CM | POA: Diagnosis not present

## 2024-04-22 DIAGNOSIS — C50212 Malignant neoplasm of upper-inner quadrant of left female breast: Secondary | ICD-10-CM | POA: Diagnosis not present

## 2024-04-22 LAB — RAD ONC ARIA SESSION SUMMARY
Course Elapsed Days: 6
Plan Fractions Treated to Date: 5
Plan Prescribed Dose Per Fraction: 2.67 Gy
Plan Total Fractions Prescribed: 15
Plan Total Prescribed Dose: 40.05 Gy
Reference Point Dosage Given to Date: 13.35 Gy
Reference Point Session Dosage Given: 2.67 Gy
Session Number: 5

## 2024-04-23 ENCOUNTER — Other Ambulatory Visit: Payer: Self-pay

## 2024-04-23 ENCOUNTER — Ambulatory Visit
Admission: RE | Admit: 2024-04-23 | Discharge: 2024-04-23 | Disposition: A | Discharge status: Home / Self Care | Source: Ambulatory Visit | Attending: Radiation Oncology | Admitting: Radiation Oncology

## 2024-04-23 DIAGNOSIS — Z510 Encounter for antineoplastic radiation therapy: Secondary | ICD-10-CM | POA: Insufficient documentation

## 2024-04-23 DIAGNOSIS — C50212 Malignant neoplasm of upper-inner quadrant of left female breast: Secondary | ICD-10-CM | POA: Diagnosis not present

## 2024-04-23 DIAGNOSIS — Z170 Estrogen receptor positive status [ER+]: Secondary | ICD-10-CM | POA: Diagnosis not present

## 2024-04-23 LAB — RAD ONC ARIA SESSION SUMMARY
Course Elapsed Days: 7
Plan Fractions Treated to Date: 6
Plan Prescribed Dose Per Fraction: 2.67 Gy
Plan Total Fractions Prescribed: 15
Plan Total Prescribed Dose: 40.05 Gy
Reference Point Dosage Given to Date: 16.02 Gy
Reference Point Session Dosage Given: 2.67 Gy
Session Number: 6

## 2024-04-24 ENCOUNTER — Other Ambulatory Visit: Payer: Self-pay

## 2024-04-24 ENCOUNTER — Ambulatory Visit
Admission: RE | Admit: 2024-04-24 | Discharge: 2024-04-24 | Disposition: A | Discharge status: Home / Self Care | Source: Ambulatory Visit | Attending: Radiation Oncology | Admitting: Radiation Oncology

## 2024-04-24 DIAGNOSIS — Z170 Estrogen receptor positive status [ER+]: Secondary | ICD-10-CM | POA: Diagnosis not present

## 2024-04-24 DIAGNOSIS — Z510 Encounter for antineoplastic radiation therapy: Secondary | ICD-10-CM | POA: Diagnosis not present

## 2024-04-24 DIAGNOSIS — C50212 Malignant neoplasm of upper-inner quadrant of left female breast: Secondary | ICD-10-CM | POA: Diagnosis not present

## 2024-04-24 LAB — RAD ONC ARIA SESSION SUMMARY
Course Elapsed Days: 8
Plan Fractions Treated to Date: 7
Plan Prescribed Dose Per Fraction: 2.67 Gy
Plan Total Fractions Prescribed: 15
Plan Total Prescribed Dose: 40.05 Gy
Reference Point Dosage Given to Date: 18.69 Gy
Reference Point Session Dosage Given: 2.67 Gy
Session Number: 7

## 2024-04-25 ENCOUNTER — Ambulatory Visit

## 2024-04-29 ENCOUNTER — Ambulatory Visit
Admission: RE | Admit: 2024-04-29 | Discharge: 2024-04-29 | Disposition: A | Discharge status: Home / Self Care | Source: Ambulatory Visit | Attending: Radiation Oncology | Admitting: Radiation Oncology

## 2024-04-29 ENCOUNTER — Other Ambulatory Visit: Payer: Self-pay

## 2024-04-29 DIAGNOSIS — Z170 Estrogen receptor positive status [ER+]: Secondary | ICD-10-CM | POA: Diagnosis not present

## 2024-04-29 DIAGNOSIS — C50212 Malignant neoplasm of upper-inner quadrant of left female breast: Secondary | ICD-10-CM | POA: Diagnosis not present

## 2024-04-29 DIAGNOSIS — Z510 Encounter for antineoplastic radiation therapy: Secondary | ICD-10-CM | POA: Diagnosis not present

## 2024-04-29 LAB — RAD ONC ARIA SESSION SUMMARY
Course Elapsed Days: 13
Plan Fractions Treated to Date: 8
Plan Prescribed Dose Per Fraction: 2.67 Gy
Plan Total Fractions Prescribed: 15
Plan Total Prescribed Dose: 40.05 Gy
Reference Point Dosage Given to Date: 21.36 Gy
Reference Point Session Dosage Given: 2.67 Gy
Session Number: 8

## 2024-04-30 ENCOUNTER — Other Ambulatory Visit: Payer: Self-pay

## 2024-04-30 ENCOUNTER — Ambulatory Visit
Admission: RE | Admit: 2024-04-30 | Discharge: 2024-04-30 | Disposition: A | Discharge status: Home / Self Care | Source: Ambulatory Visit | Attending: Radiation Oncology

## 2024-04-30 DIAGNOSIS — Z510 Encounter for antineoplastic radiation therapy: Secondary | ICD-10-CM | POA: Diagnosis not present

## 2024-04-30 DIAGNOSIS — C50212 Malignant neoplasm of upper-inner quadrant of left female breast: Secondary | ICD-10-CM | POA: Diagnosis not present

## 2024-04-30 DIAGNOSIS — Z170 Estrogen receptor positive status [ER+]: Secondary | ICD-10-CM | POA: Diagnosis not present

## 2024-04-30 LAB — RAD ONC ARIA SESSION SUMMARY
Course Elapsed Days: 14
Plan Fractions Treated to Date: 9
Plan Prescribed Dose Per Fraction: 2.67 Gy
Plan Total Fractions Prescribed: 15
Plan Total Prescribed Dose: 40.05 Gy
Reference Point Dosage Given to Date: 24.03 Gy
Reference Point Session Dosage Given: 1.335 Gy
Session Number: 9

## 2024-05-01 ENCOUNTER — Other Ambulatory Visit: Payer: Self-pay

## 2024-05-01 ENCOUNTER — Ambulatory Visit
Admission: RE | Admit: 2024-05-01 | Discharge: 2024-05-01 | Disposition: A | Discharge status: Home / Self Care | Source: Ambulatory Visit | Attending: Radiation Oncology

## 2024-05-01 DIAGNOSIS — Z510 Encounter for antineoplastic radiation therapy: Secondary | ICD-10-CM | POA: Diagnosis not present

## 2024-05-01 DIAGNOSIS — C50212 Malignant neoplasm of upper-inner quadrant of left female breast: Secondary | ICD-10-CM | POA: Diagnosis not present

## 2024-05-01 DIAGNOSIS — Z170 Estrogen receptor positive status [ER+]: Secondary | ICD-10-CM | POA: Diagnosis not present

## 2024-05-01 LAB — RAD ONC ARIA SESSION SUMMARY
Course Elapsed Days: 15
Plan Fractions Treated to Date: 10
Plan Prescribed Dose Per Fraction: 2.67 Gy
Plan Total Fractions Prescribed: 15
Plan Total Prescribed Dose: 40.05 Gy
Reference Point Dosage Given to Date: 26.7 Gy
Reference Point Session Dosage Given: 2.67 Gy
Session Number: 10

## 2024-05-02 ENCOUNTER — Other Ambulatory Visit: Payer: Self-pay

## 2024-05-02 ENCOUNTER — Ambulatory Visit
Admission: RE | Admit: 2024-05-02 | Discharge: 2024-05-02 | Disposition: A | Discharge status: Home / Self Care | Source: Ambulatory Visit | Attending: Radiation Oncology | Admitting: Radiation Oncology

## 2024-05-02 DIAGNOSIS — C50212 Malignant neoplasm of upper-inner quadrant of left female breast: Secondary | ICD-10-CM | POA: Diagnosis not present

## 2024-05-02 DIAGNOSIS — Z510 Encounter for antineoplastic radiation therapy: Secondary | ICD-10-CM | POA: Diagnosis not present

## 2024-05-02 DIAGNOSIS — Z170 Estrogen receptor positive status [ER+]: Secondary | ICD-10-CM | POA: Diagnosis not present

## 2024-05-02 LAB — RAD ONC ARIA SESSION SUMMARY
Course Elapsed Days: 16
Plan Fractions Treated to Date: 11
Plan Prescribed Dose Per Fraction: 2.67 Gy
Plan Total Fractions Prescribed: 15
Plan Total Prescribed Dose: 40.05 Gy
Reference Point Dosage Given to Date: 29.37 Gy
Reference Point Session Dosage Given: 2.67 Gy
Session Number: 11

## 2024-05-03 ENCOUNTER — Ambulatory Visit
Admission: RE | Admit: 2024-05-03 | Discharge: 2024-05-03 | Disposition: A | Discharge status: Home / Self Care | Source: Ambulatory Visit | Attending: Radiation Oncology

## 2024-05-03 ENCOUNTER — Other Ambulatory Visit: Payer: Self-pay

## 2024-05-03 DIAGNOSIS — Z170 Estrogen receptor positive status [ER+]: Secondary | ICD-10-CM | POA: Diagnosis not present

## 2024-05-03 DIAGNOSIS — C50212 Malignant neoplasm of upper-inner quadrant of left female breast: Secondary | ICD-10-CM | POA: Diagnosis not present

## 2024-05-03 DIAGNOSIS — Z510 Encounter for antineoplastic radiation therapy: Secondary | ICD-10-CM | POA: Diagnosis not present

## 2024-05-03 LAB — RAD ONC ARIA SESSION SUMMARY
Course Elapsed Days: 17
Plan Fractions Treated to Date: 12
Plan Prescribed Dose Per Fraction: 2.67 Gy
Plan Total Fractions Prescribed: 15
Plan Total Prescribed Dose: 40.05 Gy
Reference Point Dosage Given to Date: 32.04 Gy
Reference Point Session Dosage Given: 2.67 Gy
Session Number: 12

## 2024-05-06 ENCOUNTER — Other Ambulatory Visit: Payer: Self-pay

## 2024-05-06 ENCOUNTER — Ambulatory Visit
Admission: RE | Admit: 2024-05-06 | Discharge: 2024-05-06 | Disposition: A | Discharge status: Home / Self Care | Source: Ambulatory Visit | Attending: Radiation Oncology

## 2024-05-06 ENCOUNTER — Ambulatory Visit
Admission: RE | Admit: 2024-05-06 | Discharge: 2024-05-06 | Disposition: A | Discharge status: Home / Self Care | Source: Ambulatory Visit | Attending: Radiation Oncology | Admitting: Radiation Oncology

## 2024-05-06 DIAGNOSIS — C50212 Malignant neoplasm of upper-inner quadrant of left female breast: Secondary | ICD-10-CM | POA: Diagnosis not present

## 2024-05-06 DIAGNOSIS — Z170 Estrogen receptor positive status [ER+]: Secondary | ICD-10-CM | POA: Diagnosis not present

## 2024-05-06 DIAGNOSIS — Z510 Encounter for antineoplastic radiation therapy: Secondary | ICD-10-CM | POA: Diagnosis not present

## 2024-05-06 LAB — RAD ONC ARIA SESSION SUMMARY
Course Elapsed Days: 20
Plan Fractions Treated to Date: 13
Plan Prescribed Dose Per Fraction: 2.67 Gy
Plan Total Fractions Prescribed: 15
Plan Total Prescribed Dose: 40.05 Gy
Reference Point Dosage Given to Date: 34.71 Gy
Reference Point Session Dosage Given: 2.67 Gy
Session Number: 13

## 2024-05-07 ENCOUNTER — Ambulatory Visit
Admission: RE | Admit: 2024-05-07 | Discharge: 2024-05-07 | Disposition: A | Discharge status: Home / Self Care | Source: Ambulatory Visit | Attending: Radiation Oncology

## 2024-05-07 ENCOUNTER — Other Ambulatory Visit: Payer: Self-pay

## 2024-05-07 DIAGNOSIS — Z510 Encounter for antineoplastic radiation therapy: Secondary | ICD-10-CM | POA: Diagnosis not present

## 2024-05-07 DIAGNOSIS — C50212 Malignant neoplasm of upper-inner quadrant of left female breast: Secondary | ICD-10-CM | POA: Diagnosis not present

## 2024-05-07 DIAGNOSIS — Z170 Estrogen receptor positive status [ER+]: Secondary | ICD-10-CM | POA: Diagnosis not present

## 2024-05-07 LAB — RAD ONC ARIA SESSION SUMMARY
Course Elapsed Days: 21
Plan Fractions Treated to Date: 14
Plan Prescribed Dose Per Fraction: 2.67 Gy
Plan Total Fractions Prescribed: 15
Plan Total Prescribed Dose: 40.05 Gy
Reference Point Dosage Given to Date: 37.38 Gy
Reference Point Session Dosage Given: 2.67 Gy
Session Number: 14

## 2024-05-08 ENCOUNTER — Other Ambulatory Visit: Payer: Self-pay

## 2024-05-08 ENCOUNTER — Ambulatory Visit

## 2024-05-08 ENCOUNTER — Ambulatory Visit
Admission: RE | Admit: 2024-05-08 | Discharge: 2024-05-08 | Disposition: A | Discharge status: Home / Self Care | Source: Ambulatory Visit | Attending: Radiation Oncology | Admitting: Radiation Oncology

## 2024-05-08 DIAGNOSIS — Z170 Estrogen receptor positive status [ER+]: Secondary | ICD-10-CM | POA: Diagnosis not present

## 2024-05-08 DIAGNOSIS — C50212 Malignant neoplasm of upper-inner quadrant of left female breast: Secondary | ICD-10-CM | POA: Diagnosis not present

## 2024-05-08 DIAGNOSIS — Z510 Encounter for antineoplastic radiation therapy: Secondary | ICD-10-CM | POA: Diagnosis not present

## 2024-05-08 LAB — RAD ONC ARIA SESSION SUMMARY
Course Elapsed Days: 22
Plan Fractions Treated to Date: 15
Plan Prescribed Dose Per Fraction: 2.67 Gy
Plan Total Fractions Prescribed: 15
Plan Total Prescribed Dose: 40.05 Gy
Reference Point Dosage Given to Date: 40.05 Gy
Reference Point Session Dosage Given: 2.67 Gy
Session Number: 15

## 2024-05-09 ENCOUNTER — Ambulatory Visit
Admission: RE | Admit: 2024-05-09 | Discharge: 2024-05-09 | Disposition: A | Discharge status: Home / Self Care | Source: Ambulatory Visit | Attending: Radiation Oncology | Admitting: Radiation Oncology

## 2024-05-09 ENCOUNTER — Other Ambulatory Visit: Payer: Self-pay

## 2024-05-09 ENCOUNTER — Ambulatory Visit

## 2024-05-09 DIAGNOSIS — Z170 Estrogen receptor positive status [ER+]: Secondary | ICD-10-CM | POA: Diagnosis not present

## 2024-05-09 DIAGNOSIS — Z510 Encounter for antineoplastic radiation therapy: Secondary | ICD-10-CM | POA: Diagnosis not present

## 2024-05-09 DIAGNOSIS — C50212 Malignant neoplasm of upper-inner quadrant of left female breast: Secondary | ICD-10-CM | POA: Diagnosis not present

## 2024-05-09 LAB — RAD ONC ARIA SESSION SUMMARY
Course Elapsed Days: 23
Plan Fractions Treated to Date: 1
Plan Prescribed Dose Per Fraction: 2 Gy
Plan Total Fractions Prescribed: 5
Plan Total Prescribed Dose: 10 Gy
Reference Point Dosage Given to Date: 2 Gy
Reference Point Session Dosage Given: 2 Gy
Session Number: 16

## 2024-05-10 ENCOUNTER — Ambulatory Visit
Admission: RE | Admit: 2024-05-10 | Discharge: 2024-05-10 | Disposition: A | Discharge status: Home / Self Care | Source: Ambulatory Visit | Attending: Radiation Oncology | Admitting: Radiation Oncology

## 2024-05-10 ENCOUNTER — Ambulatory Visit

## 2024-05-10 ENCOUNTER — Other Ambulatory Visit: Payer: Self-pay

## 2024-05-10 DIAGNOSIS — Z510 Encounter for antineoplastic radiation therapy: Secondary | ICD-10-CM | POA: Diagnosis not present

## 2024-05-10 DIAGNOSIS — C50212 Malignant neoplasm of upper-inner quadrant of left female breast: Secondary | ICD-10-CM | POA: Diagnosis not present

## 2024-05-10 LAB — RAD ONC ARIA SESSION SUMMARY
Course Elapsed Days: 24
Plan Fractions Treated to Date: 2
Plan Prescribed Dose Per Fraction: 2 Gy
Plan Total Fractions Prescribed: 5
Plan Total Prescribed Dose: 10 Gy
Reference Point Dosage Given to Date: 4 Gy
Reference Point Session Dosage Given: 2 Gy
Session Number: 17

## 2024-05-13 ENCOUNTER — Other Ambulatory Visit: Payer: Self-pay

## 2024-05-13 ENCOUNTER — Ambulatory Visit
Admission: RE | Admit: 2024-05-13 | Discharge: 2024-05-13 | Disposition: A | Discharge status: Home / Self Care | Source: Ambulatory Visit | Attending: Radiation Oncology

## 2024-05-13 ENCOUNTER — Ambulatory Visit

## 2024-05-13 DIAGNOSIS — Z510 Encounter for antineoplastic radiation therapy: Secondary | ICD-10-CM | POA: Diagnosis not present

## 2024-05-13 DIAGNOSIS — C50212 Malignant neoplasm of upper-inner quadrant of left female breast: Secondary | ICD-10-CM | POA: Diagnosis not present

## 2024-05-13 LAB — RAD ONC ARIA SESSION SUMMARY
Course Elapsed Days: 27
Plan Fractions Treated to Date: 3
Plan Prescribed Dose Per Fraction: 2 Gy
Plan Total Fractions Prescribed: 5
Plan Total Prescribed Dose: 10 Gy
Reference Point Dosage Given to Date: 6 Gy
Reference Point Session Dosage Given: 2 Gy
Session Number: 18

## 2024-05-14 ENCOUNTER — Ambulatory Visit

## 2024-05-14 ENCOUNTER — Ambulatory Visit
Admission: RE | Admit: 2024-05-14 | Discharge: 2024-05-14 | Disposition: A | Discharge status: Home / Self Care | Source: Ambulatory Visit | Attending: Radiation Oncology | Admitting: Radiation Oncology

## 2024-05-14 ENCOUNTER — Other Ambulatory Visit: Payer: Self-pay

## 2024-05-14 DIAGNOSIS — C50212 Malignant neoplasm of upper-inner quadrant of left female breast: Secondary | ICD-10-CM | POA: Diagnosis not present

## 2024-05-14 DIAGNOSIS — Z510 Encounter for antineoplastic radiation therapy: Secondary | ICD-10-CM | POA: Diagnosis not present

## 2024-05-14 LAB — RAD ONC ARIA SESSION SUMMARY
Course Elapsed Days: 28
Plan Fractions Treated to Date: 4
Plan Prescribed Dose Per Fraction: 2 Gy
Plan Total Fractions Prescribed: 5
Plan Total Prescribed Dose: 10 Gy
Reference Point Dosage Given to Date: 8 Gy
Reference Point Session Dosage Given: 2 Gy
Session Number: 19

## 2024-05-15 ENCOUNTER — Other Ambulatory Visit: Payer: Self-pay

## 2024-05-15 ENCOUNTER — Ambulatory Visit
Admission: RE | Admit: 2024-05-15 | Discharge: 2024-05-15 | Disposition: A | Discharge status: Home / Self Care | Source: Ambulatory Visit | Attending: Radiation Oncology

## 2024-05-15 DIAGNOSIS — C50212 Malignant neoplasm of upper-inner quadrant of left female breast: Secondary | ICD-10-CM | POA: Diagnosis not present

## 2024-05-15 DIAGNOSIS — Z170 Estrogen receptor positive status [ER+]: Secondary | ICD-10-CM | POA: Diagnosis not present

## 2024-05-15 DIAGNOSIS — Z510 Encounter for antineoplastic radiation therapy: Secondary | ICD-10-CM | POA: Diagnosis not present

## 2024-05-15 LAB — RAD ONC ARIA SESSION SUMMARY
Course Elapsed Days: 29
Plan Fractions Treated to Date: 5
Plan Prescribed Dose Per Fraction: 2 Gy
Plan Total Fractions Prescribed: 5
Plan Total Prescribed Dose: 10 Gy
Reference Point Dosage Given to Date: 10 Gy
Reference Point Session Dosage Given: 2 Gy
Session Number: 20

## 2024-05-16 NOTE — Radiation Completion Notes (Signed)
 Patient Name: Breanna Powers, Breanna Powers MRN: [id number] Date of Birth: [birth year] Referring Physician: CHARMAINE BRIGHT, M.D. Date of Service: 2024-05-16 Radiation Oncologist: Lauraine Golden, M.D. [facility] [place]                             RADIATION ONCOLOGY END OF TREATMENT NOTE     Diagnosis: C50.212 Malignant neoplasm of upper-inner quadrant of left female breast Staging on 2023-11-22: Malignant neoplasm of upper-inner quadrant of left breast in female, estrogen receptor positive (HCC) T=pT1a, N=pN0, M=cM0 Intent: Curative     ==========DELIVERED PLANS==========  First Treatment Date: 2024-04-16 Last Treatment Date: 2024-05-15   Plan Name: Breast_L_BH Site: Breast, Left Technique: 3D Mode: Photon Dose Per Fraction: 2.67 Gy Prescribed Dose (Delivered / Prescribed): 40.05 Gy / 40.05 Gy Prescribed Fxs (Delivered / Prescribed): 15 / 15   Plan Name: Brst_L_Bst_BH Site: Breast, Left Technique: 3D Mode: Photon Dose Per Fraction: 2 Gy Prescribed Dose (Delivered / Prescribed): 10 Gy / 10 Gy Prescribed Fxs (Delivered / Prescribed): 5 / 5     ==========ON TREATMENT VISIT DATES========== 2024-04-22, 2024-04-23, 2024-05-06, 2024-05-13     ==========UPCOMING VISITS==========       ==========APPENDIX - ON TREATMENT VISIT NOTES==========   See weekly On Treatment Notes in Epic for details in the Media tab (listed as Progress notes on the On Treatment Visit Dates listed above).

## 2024-05-20 ENCOUNTER — Telehealth: Payer: Self-pay

## 2024-05-20 NOTE — Telephone Encounter (Signed)
 Pt verbally confirmed appt for 7/29

## 2024-05-21 ENCOUNTER — Inpatient Hospital Stay: Attending: Hematology and Oncology | Admitting: Hematology and Oncology

## 2024-05-21 ENCOUNTER — Other Ambulatory Visit: Payer: Self-pay | Admitting: Neurology

## 2024-05-21 VITALS — BP 114/69 | HR 82 | Temp 99.1°F | Resp 17 | Wt 130.0 lb

## 2024-05-21 DIAGNOSIS — L598 Other specified disorders of the skin and subcutaneous tissue related to radiation: Secondary | ICD-10-CM | POA: Diagnosis not present

## 2024-05-21 DIAGNOSIS — G20A1 Parkinson's disease without dyskinesia, without mention of fluctuations: Secondary | ICD-10-CM | POA: Insufficient documentation

## 2024-05-21 DIAGNOSIS — Y842 Radiological procedure and radiotherapy as the cause of abnormal reaction of the patient, or of later complication, without mention of misadventure at the time of the procedure: Secondary | ICD-10-CM | POA: Diagnosis not present

## 2024-05-21 DIAGNOSIS — C50212 Malignant neoplasm of upper-inner quadrant of left female breast: Secondary | ICD-10-CM | POA: Diagnosis not present

## 2024-05-21 DIAGNOSIS — Z923 Personal history of irradiation: Secondary | ICD-10-CM | POA: Insufficient documentation

## 2024-05-21 DIAGNOSIS — Z170 Estrogen receptor positive status [ER+]: Secondary | ICD-10-CM | POA: Insufficient documentation

## 2024-05-21 DIAGNOSIS — Z1732 Human epidermal growth factor receptor 2 negative status: Secondary | ICD-10-CM | POA: Insufficient documentation

## 2024-05-21 DIAGNOSIS — M8580 Other specified disorders of bone density and structure, unspecified site: Secondary | ICD-10-CM | POA: Diagnosis not present

## 2024-05-21 MED ORDER — TAMOXIFEN CITRATE 20 MG PO TABS: 20.0000 mg | ORAL_TABLET | Freq: Every day | ORAL | 3 refills | Status: DC

## 2024-05-21 NOTE — Progress Notes (Signed)
  Cancer Center Cancer Follow up:    Breanna Pfeiffer, PA-C [address] [place] [address]   DIAGNOSIS:  Cancer Staging  Malignant neoplasm of upper-inner quadrant of left breast in female, estrogen receptor positive (HCC) Staging form: Breast, AJCC 8th Edition - Pathologic stage from 11/22/2023: Stage IA (pT1a, pN0, cM0, G2, ER+, PR+, HER2-) - Signed by Loretha Ash, MD on 11/22/2023 Stage prefix: Initial diagnosis Histologic grading system: 3 grade system Laterality: Left Stage used in treatment planning: Yes National guidelines used in treatment planning: Yes Type of national guideline used in treatment planning: NCCN    SUMMARY OF ONCOLOGIC HISTORY: Oncology History  Malignant neoplasm of upper-inner quadrant of left breast in female, estrogen receptor positive (HCC)  10/09/2023 Mammogram   Mammogram showed possible asymmetry in the right breast and left breast hence diagnostic mammogram and ultrasound were recommended.  Diagnostic mammogram once again confirmed a 5 x 4 x 4 mm irregular hypoechoic mass left breast at 9 o'clock position 6 cm from the nipple.  No left axillary adenopathy.  Questioned asymmetry in the right breast resolved with additional imaging compatible with dense overlapping fibroglandular tissue.   11/14/2023 Pathology Results   Left breast needle core biopsy at 9:00 showed invasive ductal carcinoma, overall grade 2, prognostics showed ER 95% positive strong staining PR 60% positive weak to moderate staining, Ki-67 of 10% and HER2 1+.   11/20/2023 Initial Diagnosis   Malignant neoplasm of upper-inner quadrant of left breast in female, estrogen receptor positive (HCC)   11/22/2023 Cancer Staging   Staging form: Breast, AJCC 8th Edition - Pathologic stage from 11/22/2023: Stage IA (pT1a, pN0, cM0, G2, ER+, PR+, HER2-) - Signed by Loretha Ash, MD on 11/22/2023 Stage prefix: Initial diagnosis Histologic grading system: 3 grade  system Laterality: Left Stage used in treatment planning: Yes National guidelines used in treatment planning: Yes Type of national guideline used in treatment planning: NCCN   11/30/2023 Genetic Testing   Negative [facility] CancerNext+RNAinsight Panel.  Report date is 11/30/2023.  The [facility] CancerNext+RNAinsight Panel includes sequencing, rearrangement analysis, and RNA analysis for the following 39 genes: APC, ATM, BAP1, BARD1, BMPR1A, BRCA1, BRCA2, BRIP1, CDH1, CDKN2A, CHEK2, FH, FLCN, MET, MLH1, MSH2, MSH6, MUTYH, NF1, NTHL1, PALB2, PMS2, PTEN, RAD51C, RAD51D, SMAD4, STK11, TP53, TSC1, TSC2, and VHL (sequencing and deletion/duplication); AXIN2, HOXB13, MBD4, MSH3, POLD1 and POLE (sequencing only); EPCAM and GREM1 (deletion/duplication only).    12/07/2023 Pathology Results   Grade 2 IDC, Post margin pos, ER: 95%, positive, strong staining intensity  PR: 60%, positive, weak-moderate intensity  Her2: 1+, negative  Ki-67: 10%  LN negative   12/20/2023 Oncotype testing   Oncotype DX of 27, distant recurrence risk at 9 yrs of 16%, group average absolute chemo benefit greater than 15%   01/16/2024 -  Chemotherapy   Patient is on Treatment Plan : BREAST TC q21d       CURRENT THERAPY:   TC INTERVAL HISTORY:  Breanna Powers 68 y.o. female returns for follow-up and evaluation prior to receiving her fourth cycle of TC.  Discussed the use of AI scribe software for clinical note transcription with the patient, who gave verbal consent to proceed.  History of Present Illness  Breanna Powers is a 68 year old female with estrogen positive breast cancer who presents for discussion of medication options post-radiation therapy.  She completed radiation therapy approximately one week ago and is experiencing significant sensitivity and drainage from her nipple. The drainage is clear, and  she describes the area as 'very sensitive' and 'raw', with application of gel causing a burning sensation. She manages  the drainage by using a Band-Aid.  She has concerns about starting hormone therapy due to potential side effects, particularly regarding bone density and menopausal symptoms. She has a history of borderline osteoporosis based on her last bone density test and has another test scheduled for tomorrow. She is hesitant to take medications that might negatively impact her bone density.  She is currently taking medications for Parkinson's disease, including amantadine , carbidopa /levodopa , and pramipexole. She has no history of blood clots and has undergone a hysterectomy.   Patient Active Problem List   Diagnosis Date Noted   Genetic testing 11/30/2023   Malignant neoplasm of upper-inner quadrant of left breast in female, estrogen receptor positive (HCC) 11/20/2023   Hemiparesis (HCC) 06/30/2016   KNEE PAIN 09/16/2008   ITBS, RIGHT KNEE 09/16/2008   UNEQUAL LEG LENGTH 09/16/2008    has no known allergies.  MEDICAL HISTORY: Past Medical History:  Diagnosis Date   Dermatitis    Eczema    Fibroid    Hemiparesis (HCC)    right side part of the parkinson's also had a muscle tear repair in R shoulder   Hypertension    Knee pain    Menopause    Parkinson disease (HCC)     SURGICAL HISTORY: Past Surgical History:  Procedure Laterality Date   BREAST BIOPSY Left 11/14/2023   US  LT BREAST BX W LOC DEV 1ST LESION IMG BX SPEC US  GUIDE 11/14/2023 [facility]   BREAST BIOPSY Left 12/06/2023   US  LT RADIOACTIVE SEED LOC 12/06/2023 [facility]MO[facility]   BREAST LUMPECTOMY WITH RADIOACTIVE SEED AND SENTINEL LYMPH NODE BIOPSY Left 12/07/2023   Procedure: LEFT SEED LUMPECTOMY AND SENTINEL LYMPH NODE BIOPSY;  Surgeon: Aron Shoulders, MD;  Location: [facility];  Service: General;  Laterality: Left;  GEN w/REGIONAL   COLONOSCOPY     2021   CYSTOSCOPY     10/2019   SHOULDER ARTHROSCOPY Right    2018 W biceps tenodesis   TOTAL ABDOMINAL HYSTERECTOMY     10-29-2019   TRIGGER FINGER  RELEASE Left    09/29/2020   tummy tuck     04-2023   WISDOM TOOTH EXTRACTION      SOCIAL HISTORY: Social History   Socioeconomic History   Marital status: Married    Spouse name: Not on file   Number of children: 0   Years of education: Assoc   Highest education level: Not on file  Occupational History   Occupation: [employer]  Tobacco Use   Smoking status: Never   Smokeless tobacco: Never  Vaping Use   Vaping status: Never Used  Substance and Sexual Activity   Alcohol use: Yes    Alcohol/week: 2.0 standard drinks of alcohol    Types: 2 Shots of liquor per week   Drug use: Never   Sexual activity: Not on file    Comment: TAH  Other Topics Concern   Not on file  Social History Narrative   Patient drinks 3-4 cups of coffee a day    Associates degree   Right handed   Goes to the gym for exercise 3-4 times per week   Lives with husband    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: Low Risk  (03/08/2023)   Received from [facility]   Overall Financial Resource Strain (CARDIA)    Difficulty of  Paying Living Expenses: Not hard at all  Food Insecurity: No Food Insecurity (04/09/2024)   Hunger Vital Sign    Worried About Running Out of Food in the Last Year: Never true    Ran Out of Food in the Last Year: Never true  Transportation Needs: No Transportation Needs (04/09/2024)   PRAPARE - Administrator, Civil Service (Medical): No    Lack of Transportation (Non-Medical): No  Physical Activity: Insufficiently Active (08/10/2021)   Received from [facility] visits prior to 12/24/2022.   Exercise Vital Sign    On average, how many days per week do you engage in moderate to strenuous exercise (like a brisk walk)?: 1 day    On average, how many minutes do you engage in exercise at this level?: 60 min  Stress: Stress Concern Present (08/10/2021)   Received from [facility] visits prior to 12/24/2022.    [employer] of Occupational Health - Occupational Stress Questionnaire    Feeling of Stress : Rather much  Social Connections: Unknown (02/07/2023)   Received from [facility]   Social Network    Social Network: Not on file  Intimate Partner Violence: Not At Risk (04/09/2024)   Humiliation, Afraid, Rape, and Kick questionnaire    Fear of Current or Ex-Partner: No    Emotionally Abused: No    Physically Abused: No    Sexually Abused: No    FAMILY HISTORY: Family History  Problem Relation Age of Onset   Neuropathy Mother    Dementia Mother    Depression Mother    Hypertension Mother    Restless legs syndrome Mother    Parkinson's disease Father    Heart disease Father    Breast cancer Sister 75       d. 57   Atrial fibrillation Sister    Osteoporosis Sister    Parkinson's disease Sister    Heart disease Sister    Cancer Maternal Aunt        unk type; ? pancreatic; mets dx >50   Breast cancer Paternal Aunt        dx >50   Breast cancer Paternal Grandmother        mets; d. 70s   Heart disease Paternal Grandfather     Review of Systems  Constitutional:  Negative for appetite change, chills, fatigue, fever and unexpected weight change.  HENT:   Negative for hearing loss, lump/mass and trouble swallowing.   Eyes:  Negative for eye problems and icterus.  Respiratory:  Negative for chest tightness, cough and shortness of breath.   Cardiovascular:  Negative for chest pain, leg swelling and palpitations.  Gastrointestinal:  Negative for abdominal distention, abdominal pain, constipation, diarrhea, nausea and vomiting.  Endocrine: Negative for hot flashes.  Genitourinary:  Negative for difficulty urinating.   Musculoskeletal:  Negative for arthralgias.  Skin:  Negative for itching and rash.  Neurological:  Negative for dizziness, extremity weakness, headaches and numbness.  Hematological:  Negative for adenopathy. Does not bruise/bleed easily.  Psychiatric/Behavioral:   Negative for depression. The patient is not nervous/anxious.       PHYSICAL EXAMINATION    Vitals:   05/21/24 1028  BP: 114/69  Pulse: 82  Resp: 17  Temp: 99.1 F (37.3 C)  SpO2: 100%   She appears well, no acute distress Breast exam consistent with post rad changes. Small amount of serous drainage noted around the nipple. No concern for infection No LE edema.  LABORATORY DATA:  CBC    Component Value Date/Time   WBC 6.8 03/19/2024 0847   RBC 3.71 (L) 03/19/2024 0847   HGB 11.8 (L) 03/19/2024 0847   HCT 34.3 (L) 03/19/2024 0847   PLT 327 03/19/2024 0847   MCV 92.5 03/19/2024 0847   MCH 31.8 03/19/2024 0847   MCHC 34.4 03/19/2024 0847   RDW 15.5 03/19/2024 0847   LYMPHSABS 0.9 03/19/2024 0847   MONOABS 0.4 03/19/2024 0847   EOSABS 0.0 03/19/2024 0847   BASOSABS 0.0 03/19/2024 0847    CMP     Component Value Date/Time   NA 139 03/19/2024 0847   K 4.2 03/19/2024 0847   CL 107 03/19/2024 0847   CO2 25 03/19/2024 0847   GLUCOSE 123 (H) 03/19/2024 0847   BUN 14 03/19/2024 0847   CREATININE 0.81 03/19/2024 0847   CALCIUM 9.9 03/19/2024 0847   PROT 7.0 03/19/2024 0847   ALBUMIN 4.3 03/19/2024 0847   AST 13 (L) 03/19/2024 0847   ALT 10 03/19/2024 0847   ALKPHOS 108 03/19/2024 0847   BILITOT 0.6 03/19/2024 0847   GFRNONAA >60 03/19/2024 0847     ASSESSMENT and THERAPY PLAN:   Malignant neoplasm of upper-inner quadrant of left breast in female, estrogen receptor positive (HCC) Assessment and Plan Assessment & Plan Estrogen receptor positive breast cancer, post-radiation Strongly estrogen receptor positive (95%). Tamoxifen  considered for bone density benefits.  With regards to Tamoxifen , we discussed that this is a SERM, selective estrogen receptor modulator. We discussed mechanism of action of Tamoxifen , adverse effects on Tamoxifen  including but not limited to post menopausal symptoms, increased risk of DVT/PE, increased risk of endometrial cancer,  questionable cataracts with long term use and increased risk of cardiovascular events in the study which was not statistically significant. A benefit from Tamoxifen  would be improvement in bone density.  - Prescribe tamoxifen  for 3-4 months trial. - Monitor side effects and assess tolerability after 3-4 months. - Discuss continuation if well-tolerated. - Educated on 1-2% risk of blood clots with tamoxifen . - Ensure no drug interactions with Parkinson's medications.  Acute radiation dermatitis of breast Sensitive and draining nipple post-radiation. Skin raw and eroded, not infected. Expected improvement in 2-3 weeks. - Advise leaving area open to air, avoid bras at home. - Reassure improvement in 2-3 weeks.  Osteopenia Osteopenia with concerns about treatment impact on bone density. Tamoxifen  may improve bone density. - Schedule and review bone density test results.  Parkinson's disease On amantadine , carbidopa /levodopa , and pramipexole. No interactions with tamoxifen .  Follow-up Planned to assess tamoxifen  response and manage side effects. - Schedule follow-up in 3-4 months to evaluate tamoxifen  response. - Ensure pharmacy has tamoxifen  prescription.     All questions were answered. The patient knows to call the clinic with any problems, questions or concerns. We can certainly see the patient much sooner if necessary.  Total encounter time:40 minutes*in face-to-face visit time, chart review, lab review, care coordination, order entry, and documentation of the encounter time.  *Total Encounter Time as defined by the Centers for Medicare and Medicaid Services includes, in addition to the face-to-face time of a patient visit (documented in the note above) non-face-to-face time: obtaining and reviewing outside history, ordering and reviewing medications, tests or procedures, care coordination (communications with other health care professionals or caregivers) and documentation in the  medical record.

## 2024-05-21 NOTE — Assessment & Plan Note (Signed)
 Assessment and Plan Assessment & Plan Estrogen receptor positive breast cancer, post-radiation Strongly estrogen receptor positive (95%). Tamoxifen  considered for bone density benefits.  With regards to Tamoxifen , we discussed that this is a SERM, selective estrogen receptor modulator. We discussed mechanism of action of Tamoxifen , adverse effects on Tamoxifen  including but not limited to post menopausal symptoms, increased risk of DVT/PE, increased risk of endometrial cancer, questionable cataracts with long term use and increased risk of cardiovascular events in the study which was not statistically significant. A benefit from Tamoxifen  would be improvement in bone density.  - Prescribe tamoxifen  for 3-4 months trial. - Monitor side effects and assess tolerability after 3-4 months. - Discuss continuation if well-tolerated. - Educated on 1-2% risk of blood clots with tamoxifen . - Ensure no drug interactions with Parkinson's medications.  Acute radiation dermatitis of breast Sensitive and draining nipple post-radiation. Skin raw and eroded, not infected. Expected improvement in 2-3 weeks. - Advise leaving area open to air, avoid bras at home. - Reassure improvement in 2-3 weeks.  Osteopenia Osteopenia with concerns about treatment impact on bone density. Tamoxifen  may improve bone density. - Schedule and review bone density test results.  Parkinson's disease On amantadine , carbidopa /levodopa , and pramipexole. No interactions with tamoxifen .  Follow-up Planned to assess tamoxifen  response and manage side effects. - Schedule follow-up in 3-4 months to evaluate tamoxifen  response. - Ensure pharmacy has tamoxifen  prescription.

## 2024-05-22 ENCOUNTER — Encounter: Payer: Self-pay | Admitting: Hematology and Oncology

## 2024-05-22 ENCOUNTER — Telehealth: Payer: Self-pay | Admitting: Hematology and Oncology

## 2024-05-22 ENCOUNTER — Ambulatory Visit (HOSPITAL_BASED_OUTPATIENT_CLINIC_OR_DEPARTMENT_OTHER)
Admission: RE | Admit: 2024-05-22 | Discharge: 2024-05-22 | Disposition: A | Discharge status: Home / Self Care | Payer: PRIVATE HEALTH INSURANCE | Source: Ambulatory Visit | Attending: Family Medicine | Admitting: Family Medicine

## 2024-05-22 DIAGNOSIS — Z13820 Encounter for screening for osteoporosis: Secondary | ICD-10-CM | POA: Diagnosis not present

## 2024-05-22 DIAGNOSIS — M8589 Other specified disorders of bone density and structure, multiple sites: Secondary | ICD-10-CM | POA: Diagnosis not present

## 2024-05-22 DIAGNOSIS — Z780 Asymptomatic menopausal state: Secondary | ICD-10-CM | POA: Diagnosis not present

## 2024-05-22 DIAGNOSIS — M8580 Other specified disorders of bone density and structure, unspecified site: Secondary | ICD-10-CM | POA: Diagnosis not present

## 2024-05-22 NOTE — Telephone Encounter (Signed)
 Spoke with patient confirming upcoming appointment

## 2024-05-24 ENCOUNTER — Encounter: Payer: Self-pay | Admitting: Adult Health

## 2024-06-03 ENCOUNTER — Ambulatory Visit: Attending: General Surgery

## 2024-06-03 VITALS — Wt 131.1 lb

## 2024-06-03 DIAGNOSIS — Z483 Aftercare following surgery for neoplasm: Secondary | ICD-10-CM | POA: Insufficient documentation

## 2024-06-03 NOTE — Therapy (Signed)
 OUTPATIENT PHYSICAL THERAPY SOZO SCREENING NOTE   Patient Name: Breanna Powers MRN: [id number] DOB:January 31, 1956, 68 y.o., female Today's Date: 06/03/2024  PCP: Katina Pfeiffer, PA-C REFERRING PROVIDER: Aron Shoulders, MD   PT End of Session - 06/03/24 1506     Visit Number 3   # unchanged due to screen only   PT Start Time 1505    PT Stop Time 1509    PT Time Calculation (min) 4 min    Activity Tolerance Patient tolerated treatment well    Behavior During Therapy WFL for tasks assessed/performed          Past Medical History:  Diagnosis Date   Dermatitis    Eczema    Fibroid    Hemiparesis (HCC)    right side part of the parkinson's also had a muscle tear repair in R shoulder   Hypertension    Knee pain    Menopause    Parkinson disease (HCC)    Past Surgical History:  Procedure Laterality Date   BREAST BIOPSY Left 11/14/2023   US  LT BREAST BX W LOC DEV 1ST LESION IMG BX SPEC US  GUIDE 11/14/2023 [facility]   BREAST BIOPSY Left 12/06/2023   US  LT RADIOACTIVE SEED LOC 12/06/2023 [facility]MO[facility]   BREAST LUMPECTOMY WITH RADIOACTIVE SEED AND SENTINEL LYMPH NODE BIOPSY Left 12/07/2023   Procedure: LEFT SEED LUMPECTOMY AND SENTINEL LYMPH NODE BIOPSY;  Surgeon: Aron Shoulders, MD;  Location: [facility];  Service: General;  Laterality: Left;  GEN w/REGIONAL   COLONOSCOPY     2021   CYSTOSCOPY     10/2019   SHOULDER ARTHROSCOPY Right    2018 W biceps tenodesis   TOTAL ABDOMINAL HYSTERECTOMY     10-29-2019   TRIGGER FINGER RELEASE Left    09/29/2020   tummy tuck     04-2023   WISDOM TOOTH EXTRACTION     Patient Active Problem List   Diagnosis Date Noted   Genetic testing 11/30/2023   Malignant neoplasm of upper-inner quadrant of left breast in female, estrogen receptor positive (HCC) 11/20/2023   Hemiparesis (HCC) 06/30/2016   KNEE PAIN 09/16/2008   ITBS, RIGHT KNEE 09/16/2008   UNEQUAL LEG LENGTH 09/16/2008    REFERRING DIAG: left breast  cancer at risk for lymphedema  THERAPY DIAG:  Aftercare following surgery for neoplasm  PERTINENT HISTORY: Patient was diagnosed on 11/16/2023 with left grade 2 invasive ductal carcinoma breast cancer. She underwent a left lumpectomy and sentinel node biopsy on 12/07/2023 (2 negative nodes). It is ER/PR positive and HER2 negative with a Ki67 of 10%. She has a right shoulder muscle repair in 2018 resulting in right shoulder tightness. She also has well-managed Parkinson's.  PRECAUTIONS: left UE Lymphedema risk, None  SUBJECTIVE: Pt returns for her 3 month L-Dex screen.  I just finished radiation. My skin is starting to heal.   PAIN:  Are you having pain? No  SOZO SCREENING: Patient was assessed today using the SOZO machine to determine the lymphedema index score. This was compared to her baseline score. It was determined that she is within the recommended range when compared to her baseline and no further action is needed at this time. She will continue SOZO screenings. These are done every 3 months for 2 years post operatively followed by every 6 months for 2 years, and then annually.   L-DEX FLOWSHEETS - 06/03/24 1500       L-DEX LYMPHEDEMA SCREENING   Measurement Type Unilateral    L-DEX  MEASUREMENT EXTREMITY Upper Extremity    POSITION  Standing    DOMINANT SIDE Right    At Risk Side Left    BASELINE SCORE (UNILATERAL) 10.6    L-DEX SCORE (UNILATERAL) 9.3    VALUE CHANGE (UNILAT) -1.3            Aden Berwyn Caldron, PTA 06/03/2024, 3:08 PM

## 2024-06-06 DIAGNOSIS — H524 Presbyopia: Secondary | ICD-10-CM | POA: Diagnosis not present

## 2024-06-06 DIAGNOSIS — H2513 Age-related nuclear cataract, bilateral: Secondary | ICD-10-CM | POA: Diagnosis not present

## 2024-06-06 DIAGNOSIS — H5212 Myopia, left eye: Secondary | ICD-10-CM | POA: Diagnosis not present

## 2024-06-06 DIAGNOSIS — H52223 Regular astigmatism, bilateral: Secondary | ICD-10-CM | POA: Diagnosis not present

## 2024-06-06 DIAGNOSIS — H5201 Hypermetropia, right eye: Secondary | ICD-10-CM | POA: Diagnosis not present

## 2024-06-06 DIAGNOSIS — I10 Essential (primary) hypertension: Secondary | ICD-10-CM | POA: Diagnosis not present

## 2024-06-14 ENCOUNTER — Encounter: Payer: Self-pay | Admitting: Radiation Oncology

## 2024-06-14 NOTE — Progress Notes (Addendum)
 Breanna Powers presents today for follow-up after completing radiation to her left breast on 05/15/2024.  Pain: Patient experiences some left nipple pain. Skin: Skin is red and swollen with some drainage. Uses gauze pads to absorb the drainage. Seen Dr. Loretha this morning and was prescribed an antibiotic. ROM: None Lymphedema: None MedOnc F/U: 08/20/2024 with Morna Pickle, PA Other issues of note:  None Pt reports Yes No Comments  Tamoxifen  [x]  []    Letrozole []  []    Anastrazole []  []    Mammogram [x]  Date: 12/07/2023 [] 

## 2024-06-17 ENCOUNTER — Telehealth: Payer: Self-pay

## 2024-06-17 NOTE — Telephone Encounter (Signed)
 Continuation of previous note:  Pt states she noticed nipple discharge starting about a week ago but drainage has increased over the past few days. She is having to place gauze in her bra to help absorption. Pt denies odor and reports the discharge is clear/yellow in color. She was offered an [facility] visit with Morna Kendall, DNP for further eval of this and accepted. She will come in tomorrow at 0940. Message forwarded to Dr Loretha, Dr Izell and Morna Kendall, DNP for awareness.

## 2024-06-17 NOTE — Telephone Encounter (Signed)
 Pt called and LVM reporting that since completing xrt she has noticed nipple drainage from her (L) nipple. She states

## 2024-06-18 ENCOUNTER — Encounter: Payer: Self-pay | Admitting: Adult Health

## 2024-06-18 ENCOUNTER — Inpatient Hospital Stay: Attending: Hematology and Oncology | Admitting: Adult Health

## 2024-06-18 VITALS — BP 128/66 | HR 79 | Temp 97.5°F | Resp 18 | Ht 62.0 in | Wt 129.8 lb

## 2024-06-18 DIAGNOSIS — Z923 Personal history of irradiation: Secondary | ICD-10-CM | POA: Diagnosis not present

## 2024-06-18 DIAGNOSIS — Z17411 Hormone receptor positive with human epidermal growth factor receptor 2 negative status: Secondary | ICD-10-CM | POA: Diagnosis not present

## 2024-06-18 DIAGNOSIS — Z170 Estrogen receptor positive status [ER+]: Secondary | ICD-10-CM | POA: Diagnosis not present

## 2024-06-18 DIAGNOSIS — C50212 Malignant neoplasm of upper-inner quadrant of left female breast: Secondary | ICD-10-CM | POA: Insufficient documentation

## 2024-06-18 DIAGNOSIS — Z79810 Long term (current) use of selective estrogen receptor modulators (SERMs): Secondary | ICD-10-CM | POA: Insufficient documentation

## 2024-06-18 DIAGNOSIS — Z9221 Personal history of antineoplastic chemotherapy: Secondary | ICD-10-CM | POA: Diagnosis not present

## 2024-06-18 MED ORDER — AMOXICILLIN-POT CLAVULANATE 875-125 MG PO TABS: 1.0000 | ORAL_TABLET | Freq: Two times a day (BID) | ORAL | 0 refills | Status: DC

## 2024-06-18 MED ORDER — SILVER SULFADIAZINE 1 % EX CREA: 1.0000 | TOPICAL_CREAM | Freq: Every day | CUTANEOUS | 0 refills | Status: AC

## 2024-06-18 NOTE — Progress Notes (Signed)
 [facility] Cancer Follow up:    Breanna Pfeiffer, PA-C [address] [place] [address]   DIAGNOSIS:  Cancer Staging  Malignant neoplasm of upper-inner quadrant of left breast in female, estrogen receptor positive (HCC) Staging form: Breast, AJCC 8th Edition - Pathologic stage from 11/22/2023: Stage IA (pT1b, pN0, cM0, G2, ER+, PR+, HER2-, Oncotype DX score: 27) - Signed by Crawford Morna Pickle, NP on 06/18/2024 Stage prefix: Initial diagnosis Multigene prognostic tests performed: Oncotype DX Recurrence score range: Greater than or equal to 11 Histologic grading system: 3 grade system Laterality: Left Stage used in treatment planning: Yes National guidelines used in treatment planning: Yes Type of national guideline used in treatment planning: NCCN    SUMMARY OF ONCOLOGIC HISTORY: Oncology History  Malignant neoplasm of upper-inner quadrant of left breast in female, estrogen receptor positive (HCC)  10/09/2023 Mammogram   Mammogram showed possible asymmetry in the right breast and left breast hence diagnostic mammogram and ultrasound were recommended.  Diagnostic mammogram once again confirmed a 5 x 4 x 4 mm irregular hypoechoic mass left breast at 9 o'clock position 6 cm from the nipple.  No left axillary adenopathy.  Questioned asymmetry in the right breast resolved with additional imaging compatible with dense overlapping fibroglandular tissue.   11/14/2023 Pathology Results   Left breast needle core biopsy at 9:00 showed invasive ductal carcinoma, overall grade 2, prognostics showed ER 95% positive strong staining PR 60% positive weak to moderate staining, Ki-67 of 10% and HER2 1+.   11/30/2023 Genetic Testing   Negative [facility] CancerNext+RNAinsight Panel.  Report date is 11/30/2023.  The [facility] CancerNext+RNAinsight Panel includes sequencing, rearrangement analysis, and RNA analysis for the following 39 genes: APC, ATM, BAP1, BARD1, BMPR1A, BRCA1, BRCA2,  BRIP1, CDH1, CDKN2A, CHEK2, FH, FLCN, MET, MLH1, MSH2, MSH6, MUTYH, NF1, NTHL1, PALB2, PMS2, PTEN, RAD51C, RAD51D, SMAD4, STK11, TP53, TSC1, TSC2, and VHL (sequencing and deletion/duplication); AXIN2, HOXB13, MBD4, MSH3, POLD1 and POLE (sequencing only); EPCAM and GREM1 (deletion/duplication only).    12/07/2023 Pathology Results   Grade 2 IDC, Post margin pos, ER: 95%, positive, strong staining intensity  PR: 60%, positive, weak-moderate intensity  Her2: 1+, negative  Ki-67: 10%  LN negative   12/07/2023 Surgery   Left breast lumpectomy: IDC, 0.95cm grade 2, posterior margin positive, 2 SLN negative, T1b, N0   12/20/2023 Oncotype testing   Oncotype DX of 27, distant recurrence risk at 9 yrs of 16%, group average absolute chemo benefit greater than 15%   01/16/2024 - 03/19/2024 Adjuvant Chemotherapy   Taxotere /cytoxan  x 4   04/16/2024 - 05/15/2024 Radiation Therapy   Plan Name: Breast_L_BH Site: Breast, Left Technique: 3D Mode: Photon Dose Per Fraction: 2.67 Gy Prescribed Dose (Delivered / Prescribed): 40.05 Gy / 40.05 Gy Prescribed Fxs (Delivered / Prescribed): 15 / 15   Plan Name: Brst_L_Bst_BH Site: Breast, Left Technique: 3D Mode: Photon Dose Per Fraction: 2 Gy Prescribed Dose (Delivered / Prescribed): 10 Gy / 10 Gy Prescribed Fxs (Delivered / Prescribed): 5 / 5     05/2024 -  Anti-estrogen oral therapy   Tamoxifen  daily     CURRENT THERAPY: tamoxifen  daily  INTERVAL HISTORY:  Discussed the use of AI scribe software for clinical note transcription with the patient, who gave verbal consent to proceed.  History of Present Illness Breanna Powers is a 68 year old female with stage 1A ER/PR positive left breast invasive ductal carcinoma who presents with left nipple discharge.  She experiences left nipple discharge that began  approximately a week ago. The nipple is very tender and sensitive, with no increased pain in the breast. There is no fever or chills.  Her medical  history includes stage 1A ER/PR positive left breast invasive ductal carcinoma, diagnosed in January 2025. She underwent a lumpectomy, adjuvant chemotherapy with Taxotere  and Cytoxan  for four cycles, and completed radiation therapy to the left breast on May 15, 2024. She started tamoxifen  treatment shortly thereafter.  Current medications include tamoxifen .     Patient Active Problem List   Diagnosis Date Noted   Genetic testing 11/30/2023   Malignant neoplasm of upper-inner quadrant of left breast in female, estrogen receptor positive (HCC) 11/20/2023   Hemiparesis (HCC) 06/30/2016   KNEE PAIN 09/16/2008   ITBS, RIGHT KNEE 09/16/2008   UNEQUAL LEG LENGTH 09/16/2008    has no known allergies.  MEDICAL HISTORY: Past Medical History:  Diagnosis Date   Dermatitis    Eczema    Fibroid    Hemiparesis (HCC)    right side part of the parkinson's also had a muscle tear repair in R shoulder   Hypertension    Knee pain    Menopause    Parkinson disease (HCC)     SURGICAL HISTORY: Past Surgical History:  Procedure Laterality Date   BREAST BIOPSY Left 11/14/2023   US  LT BREAST BX W LOC DEV 1ST LESION IMG BX SPEC US  GUIDE 11/14/2023 [facility]   BREAST BIOPSY Left 12/06/2023   US  LT RADIOACTIVE SEED LOC 12/06/2023 [facility]AMMO[facility]   BREAST LUMPECTOMY WITH RADIOACTIVE SEED AND SENTINEL LYMPH NODE BIOPSY Left 12/07/2023   Procedure: LEFT SEED LUMPECTOMY AND SENTINEL LYMPH NODE BIOPSY;  Surgeon: Aron Shoulders, MD;  Location: [facility];  Service: General;  Laterality: Left;  GEN w/REGIONAL   COLONOSCOPY     2021   CYSTOSCOPY     10/2019   SHOULDER ARTHROSCOPY Right    2018 W biceps tenodesis   TOTAL ABDOMINAL HYSTERECTOMY     10-29-2019   TRIGGER FINGER RELEASE Left    09/29/2020   tummy tuck     04-2023   WISDOM TOOTH EXTRACTION      SOCIAL HISTORY: Social History   Socioeconomic History   Marital status: Married    Spouse name: Not on file   Number  of children: 0   Years of education: Assoc   Highest education level: Not on file  Occupational History   Occupation: [employer]  Tobacco Use   Smoking status: Never   Smokeless tobacco: Never  Vaping Use   Vaping status: Never Used  Substance and Sexual Activity   Alcohol use: Yes    Alcohol/week: 2.0 standard drinks of alcohol    Types: 2 Shots of liquor per week   Drug use: Never   Sexual activity: Not on file    Comment: TAH  Other Topics Concern   Not on file  Social History Narrative   Patient drinks 3-4 cups of coffee a day    Associates degree   Right handed   Goes to the gym for exercise 3-4 times per week   Lives with husband    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: Low Risk  (03/08/2023)   Received from [facility]   Overall Financial Resource Strain (CARDIA)    Difficulty of Paying Living Expenses: Not hard at all  Food Insecurity: No Food Insecurity (04/09/2024)   Hunger Vital Sign    Worried About Running  Out of Food in the Last Year: Never true    Ran Out of Food in the Last Year: Never true  Transportation Needs: No Transportation Needs (04/09/2024)   PRAPARE - Administrator, Civil Service (Medical): No    Lack of Transportation (Non-Medical): No  Physical Activity: Insufficiently Active (08/10/2021)   Received from [facility] visits prior to 12/24/2022.   Exercise Vital Sign    On average, how many days per week do you engage in moderate to strenuous exercise (like a brisk walk)?: 1 day    On average, how many minutes do you engage in exercise at this level?: 60 min  Stress: Stress Concern Present (08/10/2021)   Received from [facility] visits prior to 12/24/2022.   [employer] of Occupational Health - Occupational Stress Questionnaire    Feeling of Stress : Rather much  Social Connections: Unknown (02/07/2023)   Received from [facility]   Social Network     Social Network: Not on file  Intimate Partner Violence: Not At Risk (04/09/2024)   Humiliation, Afraid, Rape, and Kick questionnaire    Fear of Current or Ex-Partner: No    Emotionally Abused: No    Physically Abused: No    Sexually Abused: No    FAMILY HISTORY: Family History  Problem Relation Age of Onset   Neuropathy Mother    Dementia Mother    Depression Mother    Hypertension Mother    Restless legs syndrome Mother    Parkinson's disease Father    Heart disease Father    Breast cancer Sister 91       d. 62   Atrial fibrillation Sister    Osteoporosis Sister    Parkinson's disease Sister    Heart disease Sister    Cancer Maternal Aunt        unk type; ? pancreatic; mets dx >50   Breast cancer Paternal Aunt        dx >50   Breast cancer Paternal Grandmother        mets; d. 60s   Heart disease Paternal Grandfather     Review of Systems  Constitutional:  Negative for appetite change, chills, fatigue, fever and unexpected weight change.  HENT:   Negative for hearing loss, lump/mass and trouble swallowing.   Eyes:  Negative for eye problems and icterus.  Respiratory:  Negative for chest tightness, cough and shortness of breath.   Cardiovascular:  Negative for chest pain, leg swelling and palpitations.  Gastrointestinal:  Negative for abdominal distention, abdominal pain, constipation, diarrhea, nausea and vomiting.  Endocrine: Negative for hot flashes.  Genitourinary:  Negative for difficulty urinating.   Musculoskeletal:  Negative for arthralgias.  Skin:  Negative for itching and rash.  Neurological:  Negative for dizziness, extremity weakness, headaches and numbness.  Hematological:  Negative for adenopathy. Does not bruise/bleed easily.  Psychiatric/Behavioral:  Negative for depression. The patient is not nervous/anxious.       PHYSICAL EXAMINATION    Vitals:   06/18/24 0957  BP: 128/66  Pulse: 79  Resp: 18  Temp: (!) 97.5 F (36.4 C)  SpO2: 99%     Physical Exam Constitutional:      General: She is not in acute distress.    Appearance: Normal appearance. She is not toxic-appearing.  HENT:     Head: Normocephalic and atraumatic.  Eyes:     General: No scleral icterus. Chest:     Comments: Left nipple  is swollen, tender, erythematous and draining purulent drainage Musculoskeletal:     Cervical back: Neck supple.  Lymphadenopathy:     Cervical: No cervical adenopathy.     Upper Body:     Right upper body: No supraclavicular or axillary adenopathy.     Left upper body: No supraclavicular or axillary adenopathy.  Skin:    General: Skin is warm and dry.     Findings: No rash.     Comments: See picture below   Neurological:     General: No focal deficit present.     Mental Status: She is alert.  Psychiatric:        Mood and Affect: Mood normal.        Behavior: Behavior normal.    Nipple on day of visit  LABORATORY DATA:  CBC    Component Value Date/Time   WBC 6.8 03/19/2024 0847   RBC 3.71 (L) 03/19/2024 0847   HGB 11.8 (L) 03/19/2024 0847   HCT 34.3 (L) 03/19/2024 0847   PLT 327 03/19/2024 0847   MCV 92.5 03/19/2024 0847   MCH 31.8 03/19/2024 0847   MCHC 34.4 03/19/2024 0847   RDW 15.5 03/19/2024 0847   LYMPHSABS 0.9 03/19/2024 0847   MONOABS 0.4 03/19/2024 0847   EOSABS 0.0 03/19/2024 0847   BASOSABS 0.0 03/19/2024 0847    CMP     Component Value Date/Time   NA 139 03/19/2024 0847   K 4.2 03/19/2024 0847   CL 107 03/19/2024 0847   CO2 25 03/19/2024 0847   GLUCOSE 123 (H) 03/19/2024 0847   BUN 14 03/19/2024 0847   CREATININE 0.81 03/19/2024 0847   CALCIUM 9.9 03/19/2024 0847   PROT 7.0 03/19/2024 0847   ALBUMIN 4.3 03/19/2024 0847   AST 13 (L) 03/19/2024 0847   ALT 10 03/19/2024 0847   ALKPHOS 108 03/19/2024 0847   BILITOT 0.6 03/19/2024 0847   GFRNONAA >60 03/19/2024 0847     ASSESSMENT and THERAPY PLAN:   Assessment and Plan Assessment & Plan Left breast nipple discharge with  erythema and tenderness Nipple discharge with erythema and tenderness suggests possible infection. - Prescribed Augmentin  twice daily with food. - Advised use of silvadene  cream and radiaplex, applied a few hours apart. - Instructed to monitor symptoms, take a picture if she worsens or persists, and send a MyChart message for further evaluation. - Documented condition with a photograph for future comparison.  History of left breast invasive ductal carcinoma, ER/PR positive, status post lumpectomy, chemotherapy, radiation, and on tamoxifen  Stage 1A ER/PR positive left breast invasive ductal carcinoma, post-treatment with lumpectomy, chemotherapy, radiation, and ongoing tamoxifen  therapy. - Schedule survivorship visit to review treatment summary, potential side effects, and resources. - Coordinate follow-up appointments with oncologist to review tamoxifen  therapy and post-treatment care.  RTC in 07/2024 for SCP visit, or sooner if needed.      All questions were answered. The patient knows to call the clinic with any problems, questions or concerns. We can certainly see the patient much sooner if necessary.  Total encounter time:20 minutes*in face-to-face visit time, chart review, lab review, care coordination, order entry, and documentation of the encounter time.    Morna Kendall, NP 06/18/24 10:19 AM Medical Oncology and Hematology [facility] [address] [place], [place] [address] Tel. [phone]    Fax. [phone]  *Total Encounter Time as defined by the Centers for Medicare and Medicaid Services includes, in addition to the face-to-face time of a patient visit (documented in  the note above) non-face-to-face time: obtaining and reviewing outside history, ordering and reviewing medications, tests or procedures, care coordination (communications with other health care professionals or caregivers) and documentation in the medical record.

## 2024-06-20 ENCOUNTER — Ambulatory Visit
Admission: RE | Admit: 2024-06-20 | Discharge: 2024-06-20 | Disposition: A | Discharge status: Home / Self Care | Source: Ambulatory Visit | Attending: Radiation Oncology | Admitting: Radiation Oncology

## 2024-06-21 ENCOUNTER — Telehealth: Payer: Self-pay | Admitting: *Deleted

## 2024-06-21 NOTE — Telephone Encounter (Signed)
 Pt called with update on breast area after ABT regimen. She states that her left breast is getting better, drainage has lessened as well as soreness to breast. She stated that radio gel burns somewhat when applied to breast. Advised that pt apply sulfa cream first then radio gel after and cover with bandage. Pt verbalized understanding.

## 2024-06-25 ENCOUNTER — Encounter: Payer: Self-pay | Admitting: Hematology and Oncology

## 2024-06-25 ENCOUNTER — Telehealth: Payer: Self-pay

## 2024-06-25 DIAGNOSIS — C50212 Malignant neoplasm of upper-inner quadrant of left female breast: Secondary | ICD-10-CM

## 2024-06-25 NOTE — Telephone Encounter (Signed)
 [id number]- Effectiveness of Out-of Pocket Cost Communication and Financial Navigation (CostCOM) in Cancer Patients:    Followed up with patient via phone to confirm current medications and health information pertaining to the study via [employer] for [employer]. Confirmed patient is available for upcoming phone appointment with [employer]. Confirmed patient has not received any other financial assistance in the past 3 months. Patients has contact information if she has any questions.  Laury Quale, MPH  Clinical Research Coordinator

## 2024-06-25 NOTE — Telephone Encounter (Signed)
 Pt called and reports she did finish her course of abx prescribed by Morna Kendall, DNP. She noticed yesterday an itchy rash that appeared on her legs arms and belly. She states she has never had a reaction to amoxicillin  but that she is not surprised because all of her immediate family is also allergic. She was advised to take Benadryl and Pepcid together, and if this doesn't clear up the rash we can send in steroids.  She reports also that nipple drainage has decreased but is still present. She is going to take a photo per [employer] recommendation and send it to us  via MyChart.

## 2024-06-26 ENCOUNTER — Other Ambulatory Visit: Payer: Self-pay

## 2024-06-26 DIAGNOSIS — Z170 Estrogen receptor positive status [ER+]: Secondary | ICD-10-CM

## 2024-06-27 ENCOUNTER — Ambulatory Visit
Admission: RE | Admit: 2024-06-27 | Discharge: 2024-06-27 | Disposition: A | Discharge status: Home / Self Care | Source: Ambulatory Visit | Attending: Adult Health | Admitting: Adult Health

## 2024-06-27 ENCOUNTER — Other Ambulatory Visit: Payer: Self-pay | Admitting: Adult Health

## 2024-06-27 DIAGNOSIS — C50212 Malignant neoplasm of upper-inner quadrant of left female breast: Secondary | ICD-10-CM

## 2024-06-27 DIAGNOSIS — N6489 Other specified disorders of breast: Secondary | ICD-10-CM | POA: Diagnosis not present

## 2024-06-27 DIAGNOSIS — R92322 Mammographic fibroglandular density, left breast: Secondary | ICD-10-CM | POA: Diagnosis not present

## 2024-06-27 DIAGNOSIS — N6002 Solitary cyst of left breast: Secondary | ICD-10-CM

## 2024-06-27 HISTORY — DX: Personal history of irradiation: Z92.3

## 2024-06-27 HISTORY — DX: Personal history of antineoplastic chemotherapy: Z92.21

## 2024-06-27 HISTORY — DX: Malignant neoplasm of unspecified site of unspecified female breast: C50.919

## 2024-06-28 ENCOUNTER — Ambulatory Visit
Admission: RE | Admit: 2024-06-28 | Discharge: 2024-06-28 | Disposition: A | Discharge status: Home / Self Care | Source: Ambulatory Visit | Attending: Adult Health | Admitting: Adult Health

## 2024-06-28 DIAGNOSIS — N6002 Solitary cyst of left breast: Secondary | ICD-10-CM

## 2024-07-01 DIAGNOSIS — C50212 Malignant neoplasm of upper-inner quadrant of left female breast: Secondary | ICD-10-CM | POA: Diagnosis not present

## 2024-07-01 DIAGNOSIS — B3789 Other sites of candidiasis: Secondary | ICD-10-CM | POA: Diagnosis not present

## 2024-07-01 DIAGNOSIS — Z170 Estrogen receptor positive status [ER+]: Secondary | ICD-10-CM | POA: Diagnosis not present

## 2024-07-03 LAB — AEROBIC/ANAEROBIC CULTURE W GRAM STAIN (SURGICAL/DEEP WOUND)
Culture: NO GROWTH
Gram Stain: NONE SEEN

## 2024-07-22 DIAGNOSIS — Z170 Estrogen receptor positive status [ER+]: Secondary | ICD-10-CM | POA: Diagnosis not present

## 2024-07-22 DIAGNOSIS — B3789 Other sites of candidiasis: Secondary | ICD-10-CM | POA: Diagnosis not present

## 2024-07-22 DIAGNOSIS — C50212 Malignant neoplasm of upper-inner quadrant of left female breast: Secondary | ICD-10-CM | POA: Diagnosis not present

## 2024-08-20 ENCOUNTER — Encounter: Payer: Self-pay | Admitting: Adult Health

## 2024-08-20 ENCOUNTER — Inpatient Hospital Stay: Attending: Hematology and Oncology | Admitting: Adult Health

## 2024-08-20 ENCOUNTER — Ambulatory Visit: Admitting: Hematology and Oncology

## 2024-08-20 VITALS — BP 138/69 | HR 85 | Temp 98.8°F | Resp 17 | Ht 62.0 in | Wt 133.1 lb

## 2024-08-20 DIAGNOSIS — Z1721 Progesterone receptor positive status: Secondary | ICD-10-CM | POA: Insufficient documentation

## 2024-08-20 DIAGNOSIS — M8588 Other specified disorders of bone density and structure, other site: Secondary | ICD-10-CM | POA: Insufficient documentation

## 2024-08-20 DIAGNOSIS — Z9221 Personal history of antineoplastic chemotherapy: Secondary | ICD-10-CM | POA: Diagnosis not present

## 2024-08-20 DIAGNOSIS — Z923 Personal history of irradiation: Secondary | ICD-10-CM | POA: Diagnosis not present

## 2024-08-20 DIAGNOSIS — C50212 Malignant neoplasm of upper-inner quadrant of left female breast: Secondary | ICD-10-CM | POA: Diagnosis not present

## 2024-08-20 DIAGNOSIS — Z79810 Long term (current) use of selective estrogen receptor modulators (SERMs): Secondary | ICD-10-CM | POA: Insufficient documentation

## 2024-08-20 DIAGNOSIS — Z803 Family history of malignant neoplasm of breast: Secondary | ICD-10-CM | POA: Diagnosis not present

## 2024-08-20 DIAGNOSIS — Z1732 Human epidermal growth factor receptor 2 negative status: Secondary | ICD-10-CM | POA: Diagnosis not present

## 2024-08-20 DIAGNOSIS — I10 Essential (primary) hypertension: Secondary | ICD-10-CM | POA: Diagnosis not present

## 2024-08-20 DIAGNOSIS — Z170 Estrogen receptor positive status [ER+]: Secondary | ICD-10-CM | POA: Insufficient documentation

## 2024-08-20 DIAGNOSIS — E559 Vitamin D deficiency, unspecified: Secondary | ICD-10-CM | POA: Diagnosis not present

## 2024-08-20 DIAGNOSIS — N6489 Other specified disorders of breast: Secondary | ICD-10-CM | POA: Diagnosis not present

## 2024-08-20 NOTE — Progress Notes (Signed)
 SURVIVORSHIP VISIT:  BRIEF ONCOLOGIC HISTORY:  Oncology History  Malignant neoplasm of upper-inner quadrant of left breast in female, estrogen receptor positive (HCC)  10/09/2023 Mammogram   Mammogram showed possible asymmetry in the right breast and left breast hence diagnostic mammogram and ultrasound were recommended.  Diagnostic mammogram once again confirmed a 5 x 4 x 4 mm irregular hypoechoic mass left breast at 9 o'clock position 6 cm from the nipple.  No left axillary adenopathy.  Questioned asymmetry in the right breast resolved with additional imaging compatible with dense overlapping fibroglandular tissue.   11/14/2023 Pathology Results   Left breast needle core biopsy at 9:00 showed invasive ductal carcinoma, overall grade 2, prognostics showed ER 95% positive strong staining PR 60% positive weak to moderate staining, Ki-67 of 10% and HER2 1+.   11/30/2023 Genetic Testing   Negative [facility] CancerNext+RNAinsight Panel.  Report date is 11/30/2023.  The [facility] CancerNext+RNAinsight Panel includes sequencing, rearrangement analysis, and RNA analysis for the following 39 genes: APC, ATM, BAP1, BARD1, BMPR1A, BRCA1, BRCA2, BRIP1, CDH1, CDKN2A, CHEK2, FH, FLCN, MET, MLH1, MSH2, MSH6, MUTYH, NF1, NTHL1, PALB2, PMS2, PTEN, RAD51C, RAD51D, SMAD4, STK11, TP53, TSC1, TSC2, and VHL (sequencing and deletion/duplication); AXIN2, HOXB13, MBD4, MSH3, POLD1 and POLE (sequencing only); EPCAM and GREM1 (deletion/duplication only).    12/07/2023 Pathology Results   Grade 2 IDC, Post margin pos, ER: 95%, positive, strong staining intensity  PR: 60%, positive, weak-moderate intensity  Her2: 1+, negative  Ki-67: 10%  LN negative   12/07/2023 Surgery   Left breast lumpectomy: IDC, 0.95cm grade 2, posterior margin positive, 2 SLN negative, T1b, N0   12/20/2023 Oncotype testing   Oncotype DX of 27, distant recurrence risk at 9 yrs of 16%, group average absolute chemo benefit greater than 15%   01/16/2024 -  03/19/2024 Adjuvant Chemotherapy   Taxotere /cytoxan  x 4   04/16/2024 - 05/15/2024 Radiation Therapy   Plan Name: Breast_L_BH Site: Breast, Left Technique: 3D Mode: Photon Dose Per Fraction: 2.67 Gy Prescribed Dose (Delivered / Prescribed): 40.05 Gy / 40.05 Gy Prescribed Fxs (Delivered / Prescribed): 15 / 15   Plan Name: Brst_L_Bst_BH Site: Breast, Left Technique: 3D Mode: Photon Dose Per Fraction: 2 Gy Prescribed Dose (Delivered / Prescribed): 10 Gy / 10 Gy Prescribed Fxs (Delivered / Prescribed): 5 / 5     05/2024 -  Anti-estrogen oral therapy   Tamoxifen  daily     INTERVAL HISTORY:  Discussed the use of AI scribe software for clinical note transcription with the patient, who gave verbal consent to proceed.  History of Present Illness MODEST DRAEGER is a 68 year old female with stage 1A invasive breast cancer who presents for follow-up care.  She has estrogen and progesterone receptor positive breast cancer in the left breast. She underwent lumpectomy with removal of two lymph nodes, both negative for cancer. Post-surgery, she experienced a seroma and infection requiring drainage. Swelling is improving with massage for fluid management.  She completed chemotherapy and radiation therapy. She experiences occasional numbness in her hands but no tingling or cognitive changes. Persistent breast swelling is noted.  She discontinued tamoxifen  and is reluctant to try other antiestrogen therapies. She has osteopenia with a bone density score of -2.3 in the spine.     REVIEW OF SYSTEMS:  Review of Systems  Constitutional:  Negative for appetite change, chills, fatigue, fever and unexpected weight change.  HENT:   Negative for hearing loss, lump/mass and trouble swallowing.   Eyes:  Negative for eye problems and icterus.  Respiratory:  Negative for chest tightness, cough and shortness of breath.   Cardiovascular:  Negative for chest pain, leg swelling and palpitations.   Gastrointestinal:  Negative for abdominal distention, abdominal pain, constipation, diarrhea, nausea and vomiting.  Endocrine: Negative for hot flashes.  Genitourinary:  Negative for difficulty urinating.   Musculoskeletal:  Negative for arthralgias.  Skin:  Negative for itching and rash.  Neurological:  Negative for dizziness, extremity weakness, headaches and numbness.  Hematological:  Negative for adenopathy. Does not bruise/bleed easily.  Psychiatric/Behavioral:  Negative for depression. The patient is not nervous/anxious.   Breast: Denies any new nodularity, masses, tenderness, nipple changes, or nipple discharge.       PAST MEDICAL/SURGICAL HISTORY:  Past Medical History:  Diagnosis Date   Breast cancer (HCC)    Dermatitis    Eczema    Fibroid    Hemiparesis (HCC)    right side part of the parkinson's also had a muscle tear repair in R shoulder   Hypertension    Knee pain    Menopause    Parkinson disease (HCC)    Personal history of chemotherapy    Personal history of radiation therapy    Past Surgical History:  Procedure Laterality Date   BREAST BIOPSY Left 11/14/2023   US  LT BREAST BX W LOC DEV 1ST LESION IMG BX SPEC US  GUIDE 11/14/2023 [facility]   BREAST BIOPSY Left 12/06/2023   US  LT RADIOACTIVE SEED LOC 12/06/2023 [facility]MO[facility]   BREAST LUMPECTOMY     BREAST LUMPECTOMY WITH RADIOACTIVE SEED AND SENTINEL LYMPH NODE BIOPSY Left 12/07/2023   Procedure: LEFT SEED LUMPECTOMY AND SENTINEL LYMPH NODE BIOPSY;  Surgeon: Aron Shoulders, MD;  Location: [facility];  Service: General;  Laterality: Left;  GEN w/REGIONAL   COLONOSCOPY     2021   CYSTOSCOPY     10/2019   SHOULDER ARTHROSCOPY Right    2018 W biceps tenodesis   TOTAL ABDOMINAL HYSTERECTOMY     10-29-2019   TRIGGER FINGER RELEASE Left    09/29/2020   tummy tuck     04-2023   WISDOM TOOTH EXTRACTION       ALLERGIES:  No Known Allergies   CURRENT MEDICATIONS:  Outpatient  Encounter Medications as of 08/20/2024  Medication Sig Note   amantadine  (SYMMETREL ) 100 MG capsule TAKE 1 CAPSULE TWICE A DAY    Calcium Carb-Cholecalciferol (CALCIUM 600/VITAMIN D3) 600-20 MG-MCG TABS Take by mouth 2 (two) times daily.    carbidopa -levodopa  (SINEMET  IR) 25-100 MG tablet TAKE 1 TABLET FOUR TIMES A DAY    Cholecalciferol (VITAMIN D3) 1000 units CAPS Take 2,000 Units by mouth. 06/30/2016: Received from: [facility] Received Sig: Take 1,000 Units by mouth daily.   losartan (COZAAR) 25 MG tablet Take 25 mg by mouth daily.    Multiple Vitamin (MULTIVITAMIN) tablet Take by mouth. 06/30/2016: Received from: [facility] Received Sig: Take 1 tablet by mouth daily.   vitamin B-12 (CYANOCOBALAMIN) 500 MCG tablet Take 500 mcg by mouth daily.    pramipexole  (MIRAPEX ) 0.5 MG tablet TAKE 1 TABLET 3 TO 4 TIMES DAILY    silver  sulfADIAZINE  (SILVADENE ) 1 % cream Apply 1 Application topically daily.    tamoxifen  (NOLVADEX ) 20 MG tablet Take 1 tablet (20 mg total) by mouth daily. (Patient not taking: Reported on 08/20/2024)    [DISCONTINUED] amoxicillin -clavulanate (AUGMENTIN ) 875-125 MG tablet Take 1 tablet by mouth 2 (two) times daily.    [DISCONTINUED] carbidopa -levodopa  (SINEMET  CR) 50-200 MG tablet Take 1 tablet  by mouth at bedtime. (Patient not taking: Reported on 05/21/2024)    No facility-administered encounter medications on file as of 08/20/2024.     ONCOLOGIC FAMILY HISTORY:  Family History  Problem Relation Age of Onset   Neuropathy Mother    Dementia Mother    Depression Mother    Hypertension Mother    Restless legs syndrome Mother    Parkinson's disease Father    Heart disease Father    Breast cancer Sister 75       d. 35   Atrial fibrillation Sister    Osteoporosis Sister    Parkinson's disease Sister    Heart disease Sister    Cancer Maternal Aunt        unk type; ? pancreatic; mets dx >50   Breast cancer Paternal Aunt        dx >50   Breast cancer Paternal  Grandmother        mets; d. 55s   Heart disease Paternal Grandfather      SOCIAL HISTORY:  Social History   Socioeconomic History   Marital status: Married    Spouse name: Not on file   Number of children: 0   Years of education: Assoc   Highest education level: Not on file  Occupational History   Occupation: [employer]  Tobacco Use   Smoking status: Never   Smokeless tobacco: Never  Vaping Use   Vaping status: Never Used  Substance and Sexual Activity   Alcohol use: Yes    Alcohol/week: 2.0 standard drinks of alcohol    Types: 2 Shots of liquor per week   Drug use: Never   Sexual activity: Not on file    Comment: TAH  Other Topics Concern   Not on file  Social History Narrative   Patient drinks 3-4 cups of coffee a day    Associates degree   Right handed   Goes to the gym for exercise 3-4 times per week   Lives with husband    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: Low Risk  (03/08/2023)   Received from [facility]   Overall Financial Resource Strain (CARDIA)    Difficulty of Paying Living Expenses: Not hard at all  Food Insecurity: No Food Insecurity (04/09/2024)   Hunger Vital Sign    Worried About Running Out of Food in the Last Year: Never true    Ran Out of Food in the Last Year: Never true  Transportation Needs: No Transportation Needs (04/09/2024)   PRAPARE - Administrator, Civil Service (Medical): No    Lack of Transportation (Non-Medical): No  Physical Activity: Insufficiently Active (08/10/2021)   Received from [facility] visits prior to 12/24/2022.   Exercise Vital Sign    On average, how many days per week do you engage in moderate to strenuous exercise (like a brisk walk)?: 1 day    On average, how many minutes do you engage in exercise at this level?: 60 min  Stress: Stress Concern Present (08/10/2021)   Received from [facility] visits prior to 12/24/2022.    [employer] of Occupational Health - Occupational Stress Questionnaire    Feeling of Stress : Rather much  Social Connections: Unknown (02/07/2023)   Received from [facility]   Social Network    Social Network: Not on file  Intimate Partner Violence: Not At Risk (04/09/2024)   Humiliation, Afraid, Rape, and Kick  questionnaire    Fear of Current or Ex-Partner: No    Emotionally Abused: No    Physically Abused: No    Sexually Abused: No     OBSERVATIONS/OBJECTIVE:  BP 138/69 (Patient Position: Sitting)   Pulse 85   Temp 98.8 F (37.1 C) (Temporal)   Resp 17   Ht 5' 2 (1.575 m)   Wt 133 lb 1.6 oz (60.4 kg)   SpO2 100%   BMI 24.34 kg/m  GENERAL: Patient is a well appearing female in no acute distress HEENT:  Sclerae anicteric.  Oropharynx clear and moist. No ulcerations or evidence of oropharyngeal candidiasis. Neck is supple.  NODES:  No cervical, supraclavicular, or axillary lymphadenopathy palpated.  BREAST EXAM:  left breast s/p lumpectomy and radiation, no sign of local recurrence, right breast benign LUNGS:  Clear to auscultation bilaterally.  No wheezes or rhonchi. HEART:  Regular rate and rhythm. No murmur appreciated. ABDOMEN:  Soft, nontender.  Positive, normoactive bowel sounds. No organomegaly palpated. MSK:  No focal spinal tenderness to palpation. Full range of motion bilaterally in the upper extremities. EXTREMITIES:  No peripheral edema.   SKIN:  Clear with no obvious rashes or skin changes. No nail dyscrasia. NEURO:  Nonfocal. Well oriented.  Appropriate affect.   LABORATORY DATA:  None for this visit.  DIAGNOSTIC IMAGING:  None for this visit.     ASSESSMENT AND PLAN:  Ms.. Powers is a pleasant 68 y.o. female with Stage IA left breast invasive ductal carcinoma, ER+/PR+/HER2-, diagnosed in 11/2023, treated with lumpectomy, adjuvant chemotherapy, adjuvant radiation therapy, and anti-estrogen therapy with Tamoxifen  beginning in 05/2024 which she opted  to forego.  She presents to the Survivorship Clinic for our initial meeting and routine follow-up post-completion of treatment for breast cancer.    1. Stage IA left breast cancer:  Ms. Peach is continuing to recover from definitive treatment for breast cancer. She will follow-up with her medical oncologist, Dr.  Loretha in 12/2024 with history and physical exam per surveillance protocol.  She opted against antiestrogen therapy due to side effects. Discussed recurrence risk without antiestrogen therapy and alternative options. Emphasized healthy lifestyle choices.. Her mammogram is due 09/2024; orders placed today.  Guardant reveal blood testing ordered today.   Today, a comprehensive survivorship care plan and treatment summary was reviewed with the patient today detailing her breast cancer diagnosis, treatment course, potential late/long-term effects of treatment, appropriate follow-up care with recommendations for the future, and patient education resources.  A copy of this summary, along with a letter will be sent to the patients primary care provider via mail/fax/In Basket message after todays visit.    2. Bone health: She was given education on specific activities to promote bone health.  3. Cancer screening:  Due to Ms. Prothero's history and her age, she should receive screening for skin cancers, colon cancer, and gynecologic cancers.  The information and recommendations are listed on the patient's comprehensive care plan/treatment summary and were reviewed in detail with the patient.    4. Health maintenance and wellness promotion: Ms. Kleiber was encouraged to consume 5-7 servings of fruits and vegetables per day. We reviewed the Nutrition Rainbow handout.  She was also encouraged to engage in moderate to vigorous exercise for 30 minutes per day most days of the week.  She was instructed to limit her alcohol consumption and continue to abstain from tobacco use/.     5. Support services/counseling:  It is not uncommon for this period of the patient's cancer  care trajectory to be one of many emotions and stressors.   She was given information regarding our available services and encouraged to contact me with any questions or for help enrolling in any of our support group/programs.    Follow up instructions:    -Return to cancer center 12/2024 for f/u with Dr. Loretha -Follow-up with Dr. Aron in 10/2024  -Mammogram due in 09/2024 -Guardant reveal testing every 6 months (will begin 08/2024) -She is welcome to return back to the Survivorship Clinic at any time; no additional follow-up needed at this time.  -Consider referral back to survivorship as a long-term survivor for continued surveillance  The patient was provided an opportunity to ask questions and all were answered. The patient agreed with the plan and demonstrated an understanding of the instructions.   Total encounter time:45 minutes*in face-to-face visit time, chart review, lab review, care coordination, order entry, and documentation of the encounter time.    Morna Kendall, NP 08/20/24 10:44 AM Medical Oncology and Hematology [facility] [address] [place], [place] [address] Tel. [phone]    Fax. [phone]  *Total Encounter Time as defined by the Centers for Medicare and Medicaid Services includes, in addition to the face-to-face time of a patient visit (documented in the note above) non-face-to-face time: obtaining and reviewing outside history, ordering and reviewing medications, tests or procedures, care coordination (communications with other health care professionals or caregivers) and documentation in the medical record.

## 2024-08-21 DIAGNOSIS — Z23 Encounter for immunization: Secondary | ICD-10-CM | POA: Diagnosis not present

## 2024-08-21 DIAGNOSIS — Z1331 Encounter for screening for depression: Secondary | ICD-10-CM | POA: Diagnosis not present

## 2024-08-21 DIAGNOSIS — Z0000 Encounter for general adult medical examination without abnormal findings: Secondary | ICD-10-CM | POA: Diagnosis not present

## 2024-09-02 ENCOUNTER — Ambulatory Visit: Attending: General Surgery

## 2024-09-02 VITALS — Wt 132.5 lb

## 2024-09-02 DIAGNOSIS — Z483 Aftercare following surgery for neoplasm: Secondary | ICD-10-CM | POA: Insufficient documentation

## 2024-09-02 DIAGNOSIS — G20B1 Parkinson's disease with dyskinesia, without mention of fluctuations: Secondary | ICD-10-CM | POA: Diagnosis not present

## 2024-09-02 DIAGNOSIS — I10 Essential (primary) hypertension: Secondary | ICD-10-CM | POA: Diagnosis not present

## 2024-09-02 DIAGNOSIS — Z6823 Body mass index (BMI) 23.0-23.9, adult: Secondary | ICD-10-CM | POA: Diagnosis not present

## 2024-09-02 DIAGNOSIS — E559 Vitamin D deficiency, unspecified: Secondary | ICD-10-CM | POA: Diagnosis not present

## 2024-09-02 NOTE — Therapy (Signed)
 OUTPATIENT PHYSICAL THERAPY SOZO SCREENING NOTE   Patient Name: Breanna Powers MRN: [id number] DOB:Apr 16, 1956, 68 y.o., female Today's Date: 09/02/2024  PCP: Katina Pfeiffer, PA-C REFERRING PROVIDER: Aron Shoulders, MD   PT End of Session - 09/02/24 [phone]     Visit Number 3   # unchanged due to screen only   PT Start Time 0854    PT Stop Time 0858    PT Time Calculation (min) 4 min    Activity Tolerance Patient tolerated treatment well    Behavior During Therapy WFL for tasks assessed/performed          Past Medical History:  Diagnosis Date   Breast cancer (HCC)    Dermatitis    Eczema    Fibroid    Hemiparesis (HCC)    right side part of the parkinson's also had a muscle tear repair in R shoulder   Hypertension    Knee pain    Menopause    Parkinson disease (HCC)    Personal history of chemotherapy    Personal history of radiation therapy    Past Surgical History:  Procedure Laterality Date   BREAST BIOPSY Left 11/14/2023   US  LT BREAST BX W LOC DEV 1ST LESION IMG BX SPEC US  GUIDE 11/14/2023 [facility]   BREAST BIOPSY Left 12/06/2023   US  LT RADIOACTIVE SEED LOC 12/06/2023 [facility]MO[facility]   BREAST LUMPECTOMY     BREAST LUMPECTOMY WITH RADIOACTIVE SEED AND SENTINEL LYMPH NODE BIOPSY Left 12/07/2023   Procedure: LEFT SEED LUMPECTOMY AND SENTINEL LYMPH NODE BIOPSY;  Surgeon: Aron Shoulders, MD;  Location: [facility];  Service: General;  Laterality: Left;  GEN w/REGIONAL   COLONOSCOPY     2021   CYSTOSCOPY     10/2019   SHOULDER ARTHROSCOPY Right    2018 W biceps tenodesis   TOTAL ABDOMINAL HYSTERECTOMY     10-29-2019   TRIGGER FINGER RELEASE Left    09/29/2020   tummy tuck     04-2023   WISDOM TOOTH EXTRACTION     Patient Active Problem List   Diagnosis Date Noted   Genetic testing 11/30/2023   Malignant neoplasm of upper-inner quadrant of left breast in female, estrogen receptor positive (HCC) 11/20/2023   Hemiparesis (HCC)  06/30/2016   KNEE PAIN 09/16/2008   ITBS, 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 She underwent a left lumpectomy and sentinel node biopsy on 12/07/2023 (2 negative nodes). It is ER/PR positive and HER2 negative with a Ki67 of 10%. She has a right shoulder muscle repair in 2018 resulting in right shoulder tightness. She also has well-managed Parkinson's.  PRECAUTIONS: left UE Lymphedema risk, None  SUBJECTIVE: Pt returns for her 3 month L-Dex screen.    PAIN:  Are you having pain? No  SOZO SCREENING: Patient was assessed today using the SOZO machine to determine the lymphedema index score. This was compared to her baseline score. It was determined that she is within the recommended range when compared to her baseline and no further action is needed at this time. She will continue SOZO screenings. These are done every 3 months for 2 years post operatively followed by every 6 months for 2 years, and then annually.   L-DEX FLOWSHEETS - 09/02/24 0800  L-DEX LYMPHEDEMA SCREENING   Measurement Type Unilateral    L-DEX MEASUREMENT EXTREMITY Upper Extremity    POSITION  Standing    DOMINANT SIDE Right    At Risk Side Left    BASELINE SCORE (UNILATERAL) 10.6    L-DEX SCORE (UNILATERAL) -1.7    VALUE CHANGE (UNILAT) -12.3          P: Cont every 3 month L-Dex screens until 11/2025, and then transition to 6 months until 11/2027.  Aden Berwyn Caldron, PTA 09/02/2024, 8:58 AM

## 2024-09-04 ENCOUNTER — Ambulatory Visit: Admitting: Adult Health

## 2024-09-04 ENCOUNTER — Other Ambulatory Visit (HOSPITAL_BASED_OUTPATIENT_CLINIC_OR_DEPARTMENT_OTHER): Payer: Self-pay | Admitting: Family Medicine

## 2024-09-04 ENCOUNTER — Encounter: Payer: Self-pay | Admitting: Adult Health

## 2024-09-04 VITALS — BP 120/73 | HR 82 | Ht 62.5 in | Wt 133.6 lb

## 2024-09-04 DIAGNOSIS — E7800 Pure hypercholesterolemia, unspecified: Secondary | ICD-10-CM

## 2024-09-04 DIAGNOSIS — G20B1 Parkinson's disease with dyskinesia, without mention of fluctuations: Secondary | ICD-10-CM | POA: Diagnosis not present

## 2024-09-04 MED ORDER — AMANTADINE HCL 100 MG PO CAPS: 100.0000 mg | ORAL_CAPSULE | Freq: Two times a day (BID) | ORAL | 2 refills | Status: DC

## 2024-09-04 MED ORDER — CARBIDOPA-LEVODOPA 25-100 MG PO TABS: 1.0000 | ORAL_TABLET | Freq: Four times a day (QID) | ORAL | 2 refills | Status: DC

## 2024-09-04 MED ORDER — PRAMIPEXOLE DIHYDROCHLORIDE 0.5 MG PO TABS: 0.5000 mg | ORAL_TABLET | Freq: Four times a day (QID) | ORAL | 2 refills | Status: DC

## 2024-09-04 NOTE — Progress Notes (Signed)
 PATIENT: Breanna Powers DOB: 11-Jun-1956  REASON FOR VISIT: follow up HISTORY FROM: patient PRIMARY NEUROLOGIST: Dr. Buck  Chief Complaint  Patient presents with   RM 3    Patient is here for parkinson's follow-up - last couple of weeks noticed she has been off balanced. More clumsy than normal      HISTORY OF PRESENT ILLNESS: Today 09/04/24   Breanna Powers is a 68 y.o. female who has been followed in this office for Parkinson's disease. Returns today for follow-up.  Overall she feels relatively stable.  She has noticed in the last couple weeks she feels clumsy.  Denies any falls.  Does not use an assistive device when ambulating.  Denies tremor.  Denies any change in her gait.  Denies any trouble chewing or swallowing.  Able to complete all ADLs independently.  Remains on amantadine  100 mg twice a day, Mirapex 0.5 mg 4 times a day and Sinemet  IR 4 times a day.  Returns today for an evaluation.   HISTORY 03/14/2024: She reports holding up fairly well.  She is finishing chemotherapy, is going to have her fourth cycle next week and then will start radiation therapy in June.  She was diagnosed with breast cancer in January 2025.  She had a lumpectomy on the left.  She is currently no longer on Sinemet  CR, she ran out of it about 3 days ago and has not noticed any repercussions, would like to see if she can do without it.  She continues to take amantadine  100 mg twice daily, pramipexole 0.5 mg 4 times daily, and Sinemet  IR generic 1 pill 4 times a day.  She tries to hydrate well with water.  She has not had any recent falls.  No significant constipation.    REVIEW OF SYSTEMS: Out of a complete 14 system review of symptoms, the patient complains only of the following symptoms, and all other reviewed systems are negative.   Listed in HPI  ALLERGIES: Allergies  Allergen Reactions   Tamoxifen  Rash   Amoxicillin  Dermatitis    HOME MEDICATIONS: Outpatient Medications Prior to  Visit  Medication Sig Dispense Refill   Calcium Carb-Cholecalciferol (CALCIUM 600/VITAMIN D3) 600-20 MG-MCG TABS Take by mouth 2 (two) times daily.     Cholecalciferol (VITAMIN D3) 1000 units CAPS Take 2,000 Units by mouth.     losartan (COZAAR) 25 MG tablet Take 25 mg by mouth daily.     Multiple Vitamin (MULTIVITAMIN) tablet Take by mouth.     vitamin B-12 (CYANOCOBALAMIN) 500 MCG tablet Take 500 mcg by mouth daily.     amantadine  (SYMMETREL ) 100 MG capsule TAKE 1 CAPSULE TWICE A DAY 180 capsule 1   carbidopa -levodopa  (SINEMET  IR) 25-100 MG tablet TAKE 1 TABLET FOUR TIMES A DAY 360 tablet 3   pramipexole (MIRAPEX) 0.5 MG tablet TAKE 1 TABLET 3 TO 4 TIMES DAILY 360 tablet 3   silver  sulfADIAZINE  (SILVADENE ) 1 % cream Apply 1 Application topically daily. (Patient not taking: Reported on 09/04/2024) 50 g 0   No facility-administered medications prior to visit.    PAST MEDICAL HISTORY: Past Medical History:  Diagnosis Date   Breast cancer (HCC)    Dermatitis    Eczema    Fibroid    Hemiparesis (HCC)    right side part of the parkinson's also had a muscle tear repair in R shoulder   Hypertension    Knee pain    Menopause    Parkinson disease (HCC)  Personal history of chemotherapy    Personal history of radiation therapy     PAST SURGICAL HISTORY: Past Surgical History:  Procedure Laterality Date   BREAST BIOPSY Left 11/14/2023   US  LT BREAST BX W LOC DEV 1ST LESION IMG BX SPEC US  GUIDE 11/14/2023 [facility]   BREAST BIOPSY Left 12/06/2023   US  LT RADIOACTIVE SEED LOC 12/06/2023 [facility]   BREAST LUMPECTOMY     BREAST LUMPECTOMY WITH RADIOACTIVE SEED AND SENTINEL LYMPH NODE BIOPSY Left 12/07/2023   Procedure: LEFT SEED LUMPECTOMY AND SENTINEL LYMPH NODE BIOPSY;  Surgeon: Aron Shoulders, MD;  Location: [facility];  Service: General;  Laterality: Left;  GEN w/REGIONAL   COLONOSCOPY     2021   CYSTOSCOPY     10/2019   SHOULDER ARTHROSCOPY Right     2018 W biceps tenodesis   TOTAL ABDOMINAL HYSTERECTOMY     10-29-2019   TRIGGER FINGER RELEASE Left    09/29/2020   tummy tuck     04-2023   WISDOM TOOTH EXTRACTION      FAMILY HISTORY: Family History  Problem Relation Age of Onset   Neuropathy Mother    Dementia Mother    Depression Mother    Hypertension Mother    Restless legs syndrome Mother    Parkinson's disease Father    Heart disease Father    Breast cancer Sister 56       d. 51   Atrial fibrillation Sister    Osteoporosis Sister    Parkinson's disease Sister    Heart disease Sister    Cancer Maternal Aunt        unk type; ? pancreatic; mets dx >50   Breast cancer Paternal Aunt        dx >50   Breast cancer Paternal Grandmother        mets; d. 64s   Heart disease Paternal Grandfather     SOCIAL HISTORY: Social History   Socioeconomic History   Marital status: Married    Spouse name: Not on file   Number of children: 0   Years of education: Assoc   Highest education level: Not on file  Occupational History   Occupation: [employer]  Tobacco Use   Smoking status: Never   Smokeless tobacco: Never  Vaping Use   Vaping status: Never Used  Substance and Sexual Activity   Alcohol use: Yes    Alcohol/week: 2.0 standard drinks of alcohol    Types: 2 Shots of liquor per week   Drug use: Never   Sexual activity: Not on file    Comment: TAH  Other Topics Concern   Not on file  Social History Narrative   Patient drinks 2-3 cups of coffee a day    Associates degree   Right handed   Goes to the gym for exercise 3-4 times per week   Lives with husband    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: Low Risk  (03/08/2023)   Received from [facility]   Overall Financial Resource Strain (CARDIA)    Difficulty of Paying Living Expenses: Not hard at all  Food Insecurity: No Food Insecurity (04/09/2024)   Hunger Vital Sign    Worried About Running Out of Food in the Last Year: Never  true    Ran Out of Food in the Last Year: Never true  Transportation Needs: No Transportation Needs (04/09/2024)   PRAPARE - Transportation    Lack  of Transportation (Medical): No    Lack of Transportation (Non-Medical): No  Physical Activity: Insufficiently Active (08/10/2021)   Received from [facility] visits prior to 12/24/2022.   Exercise Vital Sign    On average, how many days per week do you engage in moderate to strenuous exercise (like a brisk walk)?: 1 day    On average, how many minutes do you engage in exercise at this level?: 60 min  Stress: Stress Concern Present (08/10/2021)   Received from [facility] visits prior to 12/24/2022.   [employer] of Occupational [employer] - Occupational Stress Questionnaire    Feeling of Stress : Rather much  Social Connections: Unknown (02/07/2023)   Received from [facility]   Social Network    Social Network: Not on file  Intimate Partner Violence: Not At Risk (04/09/2024)   Humiliation, Afraid, Rape, and Kick questionnaire    Fear of Current or Ex-Partner: No    Emotionally Abused: No    Physically Abused: No    Sexually Abused: No      PHYSICAL EXAM  Vitals:   09/04/24 1039  BP: 120/73  Pulse: 82  SpO2: 99%  Weight: 133 lb 9.6 oz (60.6 kg)  Height: 5' 2.5 (1.588 m)   Body mass index is 24.05 kg/m.  Generalized: Well developed, in no acute distress   Neurological examination  Mentation: Alert oriented to time, place, history taking. Follows all commands speech and language fluent Cranial nerve II-XII: Pupils were equal round reactive to light. Extraocular movements were full, visual field were full on confrontational test. Facial sensation and strength were normal. Uvula tongue midline. Head turning and shoulder shrug  were normal and symmetric. Motor: The motor testing reveals 5 over 5 strength of all 4 extremities. Good symmetric motor tone is noted throughout.  Severe  impairment of finger taps in the right hand.  Moderate impairment on the left side.  Mild to moderate impairment of toe taps. Sensory: Sensory testing is intact to soft touch on all 4 extremities. No evidence of extinction is noted.  Coordination: Cerebellar testing reveals good finger-nose-finger and heel-to-shin bilaterally.  Gait and station: Gait is normal.  Good stride Reflexes: Deep tendon reflexes are symmetric and normal bilaterally.   DIAGNOSTIC DATA (LABS, IMAGING, TESTING) - I reviewed patient records, labs, notes, testing and imaging myself where available.  Lab Results  Component Value Date   WBC 6.8 03/19/2024   HGB 11.8 (L) 03/19/2024   HCT 34.3 (L) 03/19/2024   MCV 92.5 03/19/2024   PLT 327 03/19/2024      Component Value Date/Time   NA 139 03/19/2024 0847   K 4.2 03/19/2024 0847   CL 107 03/19/2024 0847   CO2 25 03/19/2024 0847   GLUCOSE 123 (H) 03/19/2024 0847   BUN 14 03/19/2024 0847   CREATININE 0.81 03/19/2024 0847   CALCIUM 9.9 03/19/2024 0847   PROT 7.0 03/19/2024 0847   ALBUMIN 4.3 03/19/2024 0847   AST 13 (L) 03/19/2024 0847   ALT 10 03/19/2024 0847   ALKPHOS 108 03/19/2024 0847   BILITOT 0.6 03/19/2024 0847   GFRNONAA >60 03/19/2024 0847      ASSESSMENT AND PLAN 68 y.o. year old female  has a past medical history of Breast cancer (HCC), Dermatitis, Eczema, Fibroid, Hemiparesis (HCC), Hypertension, Knee pain, Menopause, Parkinson disease (HCC), Personal history of chemotherapy, and Personal history of radiation therapy. here with:  Parkinson's disease  Continue on amantadine  100 mg twice a  day Continue on Sinemet  25-100 mg 4 times a day Continue Mirapex 0.5 mg 4 times a day Advised if symptoms worsen or she develops new symptoms she should let us  know Follow-up in 6 months or sooner if needed   Meds ordered this encounter  Medications   amantadine  (SYMMETREL ) 100 MG capsule    Sig: Take 1 capsule (100 mg total) by mouth 2 (two) times  daily.    Dispense:  180 capsule    Refill:  2    Supervising Provider:   YAN, YIJUN [[id number]]   carbidopa -levodopa  (SINEMET  IR) 25-100 MG tablet    Sig: Take 1 tablet by mouth 4 (four) times daily.    Dispense:  360 tablet    Refill:  2    Supervising Provider:   YAN, YIJUN [[id number]]   pramipexole (MIRAPEX) 0.5 MG tablet    Sig: Take 1 tablet (0.5 mg total) by mouth in the morning, at noon, in the evening, and at bedtime.    Dispense:  360 tablet    Refill:  2    Supervising Provider:   YAN, YIJUN [[id number]]     Duwaine Russell, MSN, NP-C 09/04/2024, 2:45 PM [facility] [address] [place], [place] [address] [phone]

## 2024-09-04 NOTE — Patient Instructions (Signed)
 Continue on amantadine  100 mg twice a day Continue on Sinemet  25-100 mg 4 times a day Continue Mirapex 0.5 mg 4 times a day

## 2024-09-12 ENCOUNTER — Encounter: Payer: Self-pay | Admitting: Adult Health

## 2024-09-17 ENCOUNTER — Ambulatory Visit (HOSPITAL_BASED_OUTPATIENT_CLINIC_OR_DEPARTMENT_OTHER)
Admission: RE | Admit: 2024-09-17 | Discharge: 2024-09-17 | Disposition: A | Discharge status: Home / Self Care | Payer: Self-pay | Source: Ambulatory Visit | Attending: Family Medicine | Admitting: Family Medicine

## 2024-09-17 DIAGNOSIS — E7800 Pure hypercholesterolemia, unspecified: Secondary | ICD-10-CM | POA: Insufficient documentation

## 2024-09-27 DIAGNOSIS — K080 Exfoliation of teeth due to systemic causes: Secondary | ICD-10-CM | POA: Diagnosis not present

## 2024-09-30 ENCOUNTER — Encounter: Payer: Self-pay | Admitting: Neurology

## 2024-09-30 MED ORDER — AMANTADINE HCL 100 MG PO CAPS: 100.0000 mg | ORAL_CAPSULE | Freq: Two times a day (BID) | ORAL | 2 refills | Status: AC

## 2024-09-30 MED ORDER — CARBIDOPA-LEVODOPA 25-100 MG PO TABS: 1.0000 | ORAL_TABLET | Freq: Four times a day (QID) | ORAL | 2 refills | Status: AC

## 2024-09-30 MED ORDER — PRAMIPEXOLE DIHYDROCHLORIDE 0.5 MG PO TABS: 0.5000 mg | ORAL_TABLET | Freq: Four times a day (QID) | ORAL | 2 refills | Status: AC

## 2024-10-15 DIAGNOSIS — K080 Exfoliation of teeth due to systemic causes: Secondary | ICD-10-CM | POA: Diagnosis not present

## 2024-10-30 ENCOUNTER — Encounter: Payer: Self-pay | Admitting: Adult Health

## 2024-10-31 ENCOUNTER — Inpatient Hospital Stay: Admitting: Adult Health

## 2024-10-31 DIAGNOSIS — N6452 Nipple discharge: Secondary | ICD-10-CM

## 2024-10-31 NOTE — Progress Notes (Signed)
 [facility] Cancer Follow up:    Katina Pfeiffer, PA-C [address] [place] [address]   DIAGNOSIS: Cancer Staging  Malignant neoplasm of upper-inner quadrant of left breast in female, estrogen receptor positive (HCC) Staging form: Breast, AJCC 8th Edition - Pathologic stage from 11/22/2023: Stage IA (pT1b, pN0, cM0, G2, ER+, PR+, HER2-, Oncotype DX score: 27) - Signed by Crawford Morna Pickle, NP on 06/18/2024 Stage prefix: Initial diagnosis Multigene prognostic tests performed: Oncotype DX Recurrence score range: Greater than or equal to 11 Histologic grading system: 3 grade system Laterality: Left Stage used in treatment planning: Yes National guidelines used in treatment planning: Yes Type of national guideline used in treatment planning: NCCN  I connected with Avelina LITTIE Billing on 10/31/2024 at 11:20 AM EST by telephone and verified that I am speaking with the correct person using two identifiers. I discussed the limitations, risks, security and privacy concerns of performing an evaluation and management service by telephone and the availability of in person appointments. I also discussed with the patient that there may be a patient responsible charge related to this service. The patient expressed understanding and agreed to proceed.   Patient location: home Provider location:  [facility] office Others participating in call: none    SUMMARY OF ONCOLOGIC HISTORY: Oncology History  Malignant neoplasm of upper-inner quadrant of left breast in female, estrogen receptor positive (HCC)  10/09/2023 Mammogram   Mammogram showed possible asymmetry in the right breast and left breast hence diagnostic mammogram and ultrasound were recommended.  Diagnostic mammogram once again confirmed a 5 x 4 x 4 mm irregular hypoechoic mass left breast at 9 o'clock position 6 cm from the nipple.  No left axillary adenopathy.  Questioned asymmetry in the right breast resolved with  additional imaging compatible with dense overlapping fibroglandular tissue.   11/14/2023 Pathology Results   Left breast needle core biopsy at 9:00 showed invasive ductal carcinoma, overall grade 2, prognostics showed ER 95% positive strong staining PR 60% positive weak to moderate staining, Ki-67 of 10% and HER2 1+.   11/30/2023 Genetic Testing   Negative [facility] CancerNext+RNAinsight Panel.  Report date is 11/30/2023.  The [facility] CancerNext+RNAinsight Panel includes sequencing, rearrangement analysis, and RNA analysis for the following 39 genes: APC, ATM, BAP1, BARD1, BMPR1A, BRCA1, BRCA2, BRIP1, CDH1, CDKN2A, CHEK2, FH, FLCN, MET, MLH1, MSH2, MSH6, MUTYH, NF1, NTHL1, PALB2, PMS2, PTEN, RAD51C, RAD51D, SMAD4, STK11, TP53, TSC1, TSC2, and VHL (sequencing and deletion/duplication); AXIN2, HOXB13, MBD4, MSH3, POLD1 and POLE (sequencing only); EPCAM and GREM1 (deletion/duplication only).    12/07/2023 Pathology Results   Grade 2 IDC, Post margin pos, ER: 95%, positive, strong staining intensity  PR: 60%, positive, weak-moderate intensity  Her2: 1+, negative  Ki-67: 10%  LN negative   12/07/2023 Surgery   Left breast lumpectomy: IDC, 0.95cm grade 2, posterior margin positive, 2 SLN negative, T1b, N0   12/20/2023 Oncotype testing   Oncotype DX of 27, distant recurrence risk at 9 yrs of 16%, group average absolute chemo benefit greater than 15%   01/16/2024 - 03/19/2024 Adjuvant Chemotherapy   Taxotere /cytoxan  x 4   04/16/2024 - 05/15/2024 Radiation Therapy   Plan Name: Breast_L_BH Site: Breast, Left Technique: 3D Mode: Photon Dose Per Fraction: 2.67 Gy Prescribed Dose (Delivered / Prescribed): 40.05 Gy / 40.05 Gy Prescribed Fxs (Delivered / Prescribed): 15 / 15   Plan Name: Brst_L_Bst_BH Site: Breast, Left Technique: 3D Mode: Photon Dose Per Fraction: 2 Gy Prescribed Dose (Delivered / Prescribed): 10 Gy / 10  Gy Prescribed Fxs (Delivered / Prescribed): 5 / 5     05/2024 -  Anti-estrogen oral  therapy   Tamoxifen  daily     CURRENT THERAPY:   INTERVAL HISTORY:  Discussed the use of AI scribe software for clinical note transcription with the patient, who gave verbal consent to proceed.  History of Present Illness Breanna Powers is a 69 year old female with recurrent left breast seroma who presents with worsening left breast discharge.  Over the last 1.5 weeks, she has had recurrent left breast discharge after a period of resolution. The discharge has progressively worsened and is now associated with scabbing and a perceived fluid collection, though she does not notice increased swelling compared to baseline.  She had similar symptoms in August 2025, and a mammogram on June 27, 2024, showed a 4.1 cm left breast fluid collection. She is concerned about the chronicity and recurrence and asks whether aspiration of a current fluid collection is needed.     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  MEDICAL HISTORY: Past Medical History:  Diagnosis Date   Breast cancer (HCC)    Dermatitis    Eczema    Fibroid    Hemiparesis (HCC)    right side part of the parkinson's also had a muscle tear repair in R shoulder   Hypertension    Knee pain    Menopause    Parkinson disease (HCC)    Personal history of chemotherapy    Personal history of radiation therapy     SURGICAL HISTORY: Past Surgical History:  Procedure Laterality Date   BREAST BIOPSY Left 11/14/2023   US  LT BREAST BX W LOC DEV 1ST LESION IMG BX SPEC US  GUIDE 11/14/2023 [facility]   BREAST BIOPSY Left 12/06/2023   US  LT RADIOACTIVE SEED LOC 12/06/2023 [facility]   BREAST  LUMPECTOMY     BREAST LUMPECTOMY WITH RADIOACTIVE SEED AND SENTINEL LYMPH NODE BIOPSY Left 12/07/2023   Procedure: LEFT SEED LUMPECTOMY AND SENTINEL LYMPH NODE BIOPSY;  Surgeon: Aron Shoulders, MD;  Location: [facility];  Service: General;  Laterality: Left;  GEN w/REGIONAL   COLONOSCOPY     2021   CYSTOSCOPY     10/2019   SHOULDER ARTHROSCOPY Right    2018 W biceps tenodesis   TOTAL ABDOMINAL HYSTERECTOMY     10-29-2019   TRIGGER FINGER RELEASE Left    09/29/2020   tummy tuck     04-2023   WISDOM TOOTH EXTRACTION      SOCIAL HISTORY: Social History   Socioeconomic History   Marital status: Married    Spouse name: Not on file   Number of children: 0   Years of education: Assoc   Highest education level: Not on file  Occupational History   Occupation: [employer]  Tobacco Use   Smoking status: Never   Smokeless tobacco: Never  Vaping Use   Vaping status: Never Used  Substance and Sexual Activity   Alcohol use: Yes    Alcohol/week: 2.0 standard drinks of alcohol    Types: 2 Shots of liquor per week   Drug use: Never   Sexual activity: Not on file  Comment: TAH  Other Topics Concern   Not on file  Social History Narrative   Patient drinks 2-3 cups of coffee a day    Associates degree   Right handed   Goes to the gym for exercise 3-4 times per week   Lives with husband    Retired    Social Drivers of Health   Tobacco Use: Low Risk (09/04/2024)   Patient History    Smoking Tobacco Use: Never    Smokeless Tobacco Use: Never    Passive Exposure: Not on file  Financial Resource Strain: Low Risk (03/08/2023)   Received from [facility]   Overall Financial Resource Strain (CARDIA)    Difficulty of Paying Living Expenses: Not hard at all  Food Insecurity: No Food Insecurity (04/09/2024)   Epic    Worried About Programme Researcher, Broadcasting/film/video in the Last Year: Never true    Ran Out of Food in the Last Year: Never true  Transportation Needs: No Transportation  Needs (04/09/2024)   Epic    Lack of Transportation (Medical): No    Lack of Transportation (Non-Medical): No  Physical Activity: Not on file  Stress: Not on file  Social Connections: Unknown (02/07/2023)   Received from [facility]   Social Network    Social Network: Not on file  Intimate Partner Violence: Not At Risk (04/09/2024)   Epic    Fear of Current or Ex-Partner: No    Emotionally Abused: No    Physically Abused: No    Sexually Abused: No  Depression (PHQ2-9): Low Risk (08/20/2024)   Depression (PHQ2-9)    PHQ-2 Score: 0  Alcohol Screen: Not on file  Housing: Low Risk (04/09/2024)   Epic    Unable to Pay for Housing in the Last Year: No    Number of Times Moved in the Last Year: 0    Homeless in the Last Year: No  Utilities: Not At Risk (04/09/2024)   Epic    Threatened with loss of utilities: No  Health Literacy: Not on file    FAMILY HISTORY: Family History  Problem Relation Age of Onset   Neuropathy Mother    Dementia Mother    Depression Mother    Hypertension Mother    Restless legs syndrome Mother    Parkinson's disease Father    Heart disease Father    Breast cancer Sister 64       d. 45   Atrial fibrillation Sister    Osteoporosis Sister    Parkinson's disease Sister    Heart disease Sister    Cancer Maternal Aunt        unk type; ? pancreatic; mets dx >50   Breast cancer Paternal Aunt        dx >50   Breast cancer Paternal Grandmother        mets; d. 67s   Heart disease Paternal Grandfather     Review of Systems  Constitutional:  Negative for appetite change, chills, fatigue, fever and unexpected weight change.  HENT:   Negative for hearing loss, lump/mass and trouble swallowing.   Eyes:  Negative for eye problems and icterus.  Respiratory:  Negative for chest tightness, cough and shortness of breath.   Cardiovascular:  Negative for chest pain, leg swelling and palpitations.  Gastrointestinal:  Negative for abdominal distention, abdominal  pain, constipation, diarrhea, nausea and vomiting.  Endocrine: Negative for hot flashes.  Genitourinary:  Negative for difficulty urinating.   Musculoskeletal:  Negative for arthralgias.  Skin:  Negative for itching and rash.  Neurological:  Negative for dizziness, extremity weakness, headaches and numbness.  Hematological:  Negative for adenopathy. Does not bruise/bleed easily.  Psychiatric/Behavioral:  Negative for depression. The patient is not nervous/anxious.       PHYSICAL EXAMINATION  Pt sounds well, in no apparent distress  ASSESSMENT and THERAPY PLAN:   Assessment and Plan Assessment & Plan Nipple discharge of the left breast Recurrent left breast nipple discharge with scabbing and fluid collection suggests persistent or recurrent seroma or other pathology. Bilateral diag mammo scheduled on 11/13/2023. - Ordered left breast ultrasound to evaluate for recurrent fluid collection or other pathology. - Coordinated imaging and follow-up with the breast center. - Instructed her to expect contact from the breast center for scheduling and to notify the office if not contacted within two days.   The patient was provided an opportunity to ask questions and all were answered. The patient agreed with the plan and demonstrated an understanding of the instructions.   The patient was advised to call back or seek an in-person evaluation if the symptoms worsen or if the condition fails to improve as anticipated.   I provided 10 minutes of non face-to-face telephone visit time during this encounter, and > 50% was spent counseling as documented under my assessment & plan.   Morna Kendall, NP 10/31/2024 11:30 AM Medical Oncology and Hematology [facility] [address] [place], [place] [address] Tel. [phone]    Fax. [phone]  *Total Encounter Time as defined by the Centers for Medicare and Medicaid Services includes, in addition to the face-to-face time of a patient  visit (documented in the note above) non-face-to-face time: obtaining and reviewing outside history, ordering and reviewing medications, tests or procedures, care coordination (communications with other health care professionals or caregivers) and documentation in the medical record.

## 2024-11-07 ENCOUNTER — Other Ambulatory Visit: Payer: Self-pay | Admitting: Adult Health

## 2024-11-07 DIAGNOSIS — N6452 Nipple discharge: Secondary | ICD-10-CM

## 2024-11-07 DIAGNOSIS — C50212 Malignant neoplasm of upper-inner quadrant of left female breast: Secondary | ICD-10-CM

## 2024-11-12 ENCOUNTER — Ambulatory Visit
Admission: RE | Admit: 2024-11-12 | Discharge: 2024-11-12 | Disposition: A | Source: Ambulatory Visit | Attending: Adult Health | Admitting: Adult Health

## 2024-11-12 ENCOUNTER — Encounter

## 2024-11-12 DIAGNOSIS — N6452 Nipple discharge: Secondary | ICD-10-CM

## 2024-11-12 DIAGNOSIS — Z170 Estrogen receptor positive status [ER+]: Secondary | ICD-10-CM

## 2024-11-18 ENCOUNTER — Ambulatory Visit: Admitting: Adult Health

## 2024-12-02 ENCOUNTER — Ambulatory Visit: Attending: General Surgery

## 2025-01-17 ENCOUNTER — Inpatient Hospital Stay: Admitting: Hematology and Oncology

## 2025-06-23 ENCOUNTER — Ambulatory Visit: Admitting: Adult Health
